# Patient Record
Sex: Male | Born: 1955 | Race: White | Hispanic: No | Marital: Married | Smoking: Never smoker
Health system: Southern US, Community
[De-identification: ages and names within clinical notes are randomized; demographics above are authoritative.]

## PROBLEM LIST (undated history)

## (undated) DIAGNOSIS — R972 Elevated prostate specific antigen [PSA]: Secondary | ICD-10-CM

## (undated) DIAGNOSIS — D649 Anemia, unspecified: Secondary | ICD-10-CM

## (undated) DIAGNOSIS — I2510 Atherosclerotic heart disease of native coronary artery without angina pectoris: Secondary | ICD-10-CM

## (undated) DIAGNOSIS — K219 Gastro-esophageal reflux disease without esophagitis: Secondary | ICD-10-CM

## (undated) DIAGNOSIS — I499 Cardiac arrhythmia, unspecified: Secondary | ICD-10-CM

## (undated) DIAGNOSIS — I219 Acute myocardial infarction, unspecified: Secondary | ICD-10-CM

## (undated) DIAGNOSIS — C801 Malignant (primary) neoplasm, unspecified: Secondary | ICD-10-CM

## (undated) DIAGNOSIS — J45909 Unspecified asthma, uncomplicated: Secondary | ICD-10-CM

## (undated) HISTORY — DX: Elevated prostate specific antigen (PSA): R97.20

## (undated) HISTORY — DX: Acute myocardial infarction, unspecified: I21.9

## (undated) HISTORY — DX: Cardiac arrhythmia, unspecified: I49.9

## (undated) HISTORY — PX: NASAL SINUS SURGERY: SHX719

## (undated) HISTORY — DX: Atherosclerotic heart disease of native coronary artery without angina pectoris: I25.10

---

## 1996-12-18 HISTORY — PX: KNEE SURGERY: SHX244

## 2008-12-18 HISTORY — PX: CORONARY ANGIOPLASTY WITH STENT PLACEMENT: SHX49

## 2009-01-11 ENCOUNTER — Ambulatory Visit: Payer: Self-pay | Admitting: Emergency Medicine

## 2009-01-11 ENCOUNTER — Inpatient Hospital Stay: Payer: Self-pay | Admitting: Cardiology

## 2009-01-11 DIAGNOSIS — I2510 Atherosclerotic heart disease of native coronary artery without angina pectoris: Secondary | ICD-10-CM

## 2009-02-16 ENCOUNTER — Encounter: Payer: Self-pay | Admitting: Cardiology

## 2009-03-18 ENCOUNTER — Encounter: Payer: Self-pay | Admitting: Cardiology

## 2009-08-09 ENCOUNTER — Ambulatory Visit: Payer: Self-pay | Admitting: Gastroenterology

## 2014-08-26 DIAGNOSIS — D509 Iron deficiency anemia, unspecified: Secondary | ICD-10-CM | POA: Insufficient documentation

## 2014-08-26 DIAGNOSIS — E785 Hyperlipidemia, unspecified: Secondary | ICD-10-CM | POA: Insufficient documentation

## 2014-08-26 DIAGNOSIS — D649 Anemia, unspecified: Secondary | ICD-10-CM | POA: Insufficient documentation

## 2015-08-24 ENCOUNTER — Encounter: Payer: Self-pay | Admitting: Obstetrics and Gynecology

## 2015-08-24 ENCOUNTER — Ambulatory Visit (INDEPENDENT_AMBULATORY_CARE_PROVIDER_SITE_OTHER): Payer: 59 | Admitting: Obstetrics and Gynecology

## 2015-08-24 ENCOUNTER — Encounter: Payer: Self-pay | Admitting: *Deleted

## 2015-08-24 VITALS — BP 125/82 | HR 77 | Resp 18 | Ht 74.0 in | Wt 256.6 lb

## 2015-08-24 DIAGNOSIS — R972 Elevated prostate specific antigen [PSA]: Secondary | ICD-10-CM

## 2015-08-24 DIAGNOSIS — I10 Essential (primary) hypertension: Secondary | ICD-10-CM | POA: Insufficient documentation

## 2015-08-24 LAB — MICROSCOPIC EXAMINATION
BACTERIA UA: NONE SEEN
EPITHELIAL CELLS (NON RENAL): NONE SEEN /HPF (ref 0–10)

## 2015-08-24 LAB — URINALYSIS, COMPLETE
Bilirubin, UA: NEGATIVE
GLUCOSE, UA: NEGATIVE
KETONES UA: NEGATIVE
Leukocytes, UA: NEGATIVE
NITRITE UA: NEGATIVE
Protein, UA: NEGATIVE
SPEC GRAV UA: 1.02 (ref 1.005–1.030)
UUROB: 0.2 mg/dL (ref 0.2–1.0)
pH, UA: 5.5 (ref 5.0–7.5)

## 2015-08-24 NOTE — Progress Notes (Signed)
08/24/2015 12:22 PM   Kenneth Mayo [birth year] [id number]  Referring provider: No referring provider defined for this encounter.  No chief complaint on file.   HPI: Mr. Beever is a 59 year old Caucasian male presenting today for 6 month follow-up for elevated PSA. He initially presented to our office on 03/24/15 with an elevated PSA 5.66 on 02/2015. Previous PSA on 02/2014 3.72. DRE was unremarkable at that time except for a slightly enlarged prostate. Patient denies any urinary symptoms including gross hematuria, dysuria, hesitancy, or sensation of incomplete bladder emptying.  03/24/15 PSA 3.2.  04/01/15 4K score indicating a 93% probability that the patient will NOT have aggressive disease on a prostate biopsy.   Patient does have a family history of prostate cancer in his brother who is only 10 months older than him area his brother who underwent radiation therapy.  I-PSS 9 QOL 2  PSA History: 02/2014 PSA 3.72 02/2015 PSA 5.66 03/24/15 PSA 3.2 04/01/2015 4K score 93% probability of NOT having aggressive prostate cancer on biopsy   PMH: Past Medical History  Diagnosis Date  . CAD (coronary artery disease)   . Heart attack   . Arrhythmia   . Elevated PSA     Surgical History: Past Surgical History  Procedure Laterality Date  . Coronary angioplasty with stent placement  2010  . Nasal sinus surgery      Home Medications:    Medication List       This list is accurate as of: 08/24/15 12:22 PM.  Always use your most recent med list.               aspirin 81 MG tablet  Take 81 mg by mouth daily.     clopidogrel 75 MG tablet  Commonly known as:  PLAVIX  Take 75 mg by mouth daily.     ferrous sulfate 325 (65 FE) MG tablet  Take 325 mg by mouth daily with breakfast.     Fish Oil 1000 MG Caps  Take 1 capsule by mouth.     glucosamine-chondroitin 500-400 MG tablet  Take 1 tablet by mouth 3 (three) times daily.     lisinopril 5 MG tablet  Commonly known as:   PRINIVIL,ZESTRIL  Take 5 mg by mouth daily.     metoprolol succinate 25 MG 24 hr tablet  Commonly known as:  TOPROL-XL  Take 25 mg by mouth daily.     pantoprazole 40 MG tablet  Commonly known as:  PROTONIX  Take 40 mg by mouth daily.     simvastatin 40 MG tablet  Commonly known as:  ZOCOR  Take 40 mg by mouth daily.        Allergies:  Allergies  Allergen Reactions  . Cefaclor Other (See Comments)  . Aspirin Rash  . Keflex [Cephalexin] Rash  . Penicillins Rash    Family History: Family History  Problem Relation Age of Onset  . Heart disease Father   . Prostate cancer Brother     Social History:  reports that he has never smoked. He does not have any smokeless tobacco history on file. He reports that he drinks alcohol. His drug history is not on file.  ROS: UROLOGY Frequent Urination?: No Hard to postpone urination?: No Burning/pain with urination?: No Get up at night to urinate?: No Leakage of urine?: No Urine stream starts and stops?: No Trouble starting stream?: No Do you have to strain to urinate?: No Blood in urine?: No Urinary tract infection?: No Sexually  transmitted disease?: No Injury to kidneys or bladder?: No Painful intercourse?: No Weak stream?: No Erection problems?: No Penile pain?: No  Gastrointestinal Nausea?: No Vomiting?: No Indigestion/heartburn?: No Diarrhea?: No Constipation?: No  Constitutional Fever: No Night sweats?: No Weight loss?: No Fatigue?: No  Skin Skin rash/lesions?: No Itching?: No  Eyes Blurred vision?: No Double vision?: No  Ears/Nose/Throat Sore throat?: No Sinus problems?: No  Hematologic/Lymphatic Swollen glands?: No Easy bruising?: No  Cardiovascular Leg swelling?: No Chest pain?: No  Respiratory Cough?: No Shortness of breath?: No  Endocrine Excessive thirst?: No  Musculoskeletal Back pain?: No Joint pain?: No  Neurological Headaches?: No Dizziness?:  No  Psychologic Depression?: No Anxiety?: No  Physical Exam: BP 125/82 mmHg  Pulse 77  Resp 18  Ht 6\' 2"  (1.88 m)  Wt 256 lb 9.6 oz (116.393 kg)  BMI 32.93 kg/m2  Constitutional:  Alert and oriented, No acute distress. HEENT: [place] AT, moist mucus membranes.  Trachea midline, no masses. Cardiovascular: No clubbing, cyanosis, or edema. Respiratory: Normal respiratory effort, no increased work of breathing. GU: No CVA tenderness. Prostate slightly enlarged on exam, smooth without nodules, nontender Skin: No rashes, bruises or suspicious lesions. Lymph: No cervical adenopathy. Neurologic: Grossly intact, no focal deficits, moving all 4 extremities. Psychiatric: Normal mood and affect.  Results for orders placed or performed in visit on 08/24/15  Microscopic Examination  Result Value Ref Range   WBC, UA 0-5 0 -  5 /hpf   RBC, UA 3-10 (A) 0 -  2 /hpf   Epithelial Cells (non renal) None seen 0 - 10 /hpf   Mucus, UA Present (A) Not Estab.   Bacteria, UA None seen None seen/Few  PSA  Result Value Ref Range   Prostate Specific Ag, Serum 3.7 0.0 - 4.0 ng/mL  Urinalysis, Complete  Result Value Ref Range   Specific Gravity, UA 1.020 1.005 - 1.030   pH, UA 5.5 5.0 - 7.5   Color, UA Yellow Yellow   Appearance Ur Clear Clear   Leukocytes, UA Negative Negative   Protein, UA Negative Negative/Trace   Glucose, UA Negative Negative   Ketones, UA Negative Negative   RBC, UA 2+ (A) Negative   Bilirubin, UA Negative Negative   Urobilinogen, Ur 0.2 0.2 - 1.0 mg/dL   Nitrite, UA Negative Negative   Microscopic Examination See below:    Assessment & Plan:    1. Elevated PSA DRE unchanged today but is limited due to patient's body habitus. It is slightly enlarged but smooth. We'll recheck PSA today and if stable will follow up in 6 months. If PSA is within normal limits patient should be able to go to yearly follow-up. - PSA - Urinalysis, Complete - BLADDER SCAN AMB NON-IMAGING  Return  in about 6 months (around 02/21/2016) for DRE/PSA.  Herbert Moors, [facility] [address] [place], [place] [address] [phone]

## 2015-08-25 ENCOUNTER — Telehealth: Payer: Self-pay | Admitting: *Deleted

## 2015-08-25 LAB — PSA: Prostate Specific Ag, Serum: 3.7 ng/mL (ref 0.0–4.0)

## 2015-08-25 NOTE — Telephone Encounter (Signed)
LM with male at the home number listed, it is a business number, left message for patient to return our call.

## 2015-08-25 NOTE — Telephone Encounter (Signed)
I spoke w/the patient and relayed their lab results.  The pt indicated understanding and had no questions. 

## 2015-08-25 NOTE — Telephone Encounter (Signed)
-----   Message from Roda Shutters, [place] sent at 08/25/2015  8:41 AM EDT ----- Please notify patient that his PSA is stable at 3.7. He needs to keep his 6 month follow-up for recheck as scheduled. Thank you

## 2016-01-10 ENCOUNTER — Encounter: Payer: Self-pay | Admitting: Obstetrics and Gynecology

## 2016-02-21 ENCOUNTER — Ambulatory Visit (INDEPENDENT_AMBULATORY_CARE_PROVIDER_SITE_OTHER): Payer: BLUE CROSS/BLUE SHIELD | Admitting: Urology

## 2016-02-21 ENCOUNTER — Encounter: Payer: Self-pay | Admitting: Urology

## 2016-02-21 VITALS — BP 156/85 | HR 74 | Ht 74.0 in | Wt 251.4 lb

## 2016-02-21 DIAGNOSIS — R3129 Other microscopic hematuria: Secondary | ICD-10-CM

## 2016-02-21 DIAGNOSIS — R972 Elevated prostate specific antigen [PSA]: Secondary | ICD-10-CM | POA: Diagnosis not present

## 2016-02-21 LAB — URINALYSIS, COMPLETE
BILIRUBIN UA: NEGATIVE
Glucose, UA: NEGATIVE
KETONES UA: NEGATIVE
LEUKOCYTES UA: NEGATIVE
Nitrite, UA: NEGATIVE
PROTEIN UA: NEGATIVE
SPEC GRAV UA: 1.02 (ref 1.005–1.030)
Urobilinogen, Ur: 0.2 mg/dL (ref 0.2–1.0)
pH, UA: 6.5 (ref 5.0–7.5)

## 2016-02-21 LAB — MICROSCOPIC EXAMINATION
Bacteria, UA: NONE SEEN
Epithelial Cells (non renal): NONE SEEN /hpf (ref 0–10)
WBC, UA: NONE SEEN /hpf (ref 0–?)

## 2016-02-21 NOTE — Progress Notes (Signed)
F/u -   1 - elevated PSA-patient with a family history of prostate cancer. His brother is 10 months older and underwent radiation therapy. Patient has never had a prostate biopsy.  PSA history:  02/2014 PSA 3.72 02/2015 PSA 5.66 03/24/15 PSA 3.2 04/01/2015 4K score 93% probability of NOT having aggressive prostate cancer on biopsy 08/2015 PSA 3.7 with normal DRE  He has a history of BPH on surveillance.   I-PSS 9 QOL 2  Today, patient seen for the above. His PSA is pending. He has a new issue which is microscopic hematuria. His September 2016 urinalysis showed 3-10 red blood cells per high-powered field. His urinalysis today also shows 3-10 red blood cells per high-powered field. No sign of infection or bacteria. Patient denies any gross hematuria. He has occasional frequency but no dysuria. He has a history of kidney stones. 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   A 13 system review of systems was obtained which was negative except for the following: Urinary frequency, sinus problems, joint pain   Social history: tobacco use no. Alcohol use yes. Recreational drug use no.   Assessment/plan: Elevated PSA-we discussed again the nature risk and benefits of PSA screening and the nature of elevated PSA. PSA today is pending.  Microscopic hematuria-we discussed the nature of microscopic hematuria. We discussed the nature risks and benefits of the CT scan with IV contrast and cystoscopy and the patient elects to proceed.

## 2016-02-22 LAB — PSA: PROSTATE SPECIFIC AG, SERUM: 3.9 ng/mL (ref 0.0–4.0)

## 2016-03-14 ENCOUNTER — Ambulatory Visit
Admission: RE | Admit: 2016-03-14 | Discharge: 2016-03-14 | Disposition: A | Discharge status: Home / Self Care | Payer: BLUE CROSS/BLUE SHIELD | Source: Ambulatory Visit | Attending: Urology | Admitting: Urology

## 2016-03-14 DIAGNOSIS — N400 Enlarged prostate without lower urinary tract symptoms: Secondary | ICD-10-CM | POA: Insufficient documentation

## 2016-03-14 DIAGNOSIS — I2510 Atherosclerotic heart disease of native coronary artery without angina pectoris: Secondary | ICD-10-CM | POA: Diagnosis not present

## 2016-03-14 DIAGNOSIS — K760 Fatty (change of) liver, not elsewhere classified: Secondary | ICD-10-CM | POA: Diagnosis not present

## 2016-03-14 DIAGNOSIS — R3129 Other microscopic hematuria: Secondary | ICD-10-CM | POA: Diagnosis present

## 2016-03-14 LAB — POCT I-STAT CREATININE: CREATININE: 0.9 mg/dL (ref 0.61–1.24)

## 2016-03-14 IMAGING — CT CT ABD-PEL WO/W CM
2 of 4 series · 13 of 32 positions shown, 18 images · IV contrast (iopamidol)
Comparison: None.

CLINICAL DATA: Elevated PSA.  Macroscopic hematuria.  Asymptomatic.

EXAM:
CT ABDOMEN AND PELVIS WITHOUT AND WITH CONTRAST
TECHNIQUE: Multidetector CT imaging of the abdomen and pelvis was performed
following the standard protocol before and following the bolus
administration of intravenous contrast.
CONTRAST:  125mL [0W] IOPAMIDOL ([0W]) INJECTION 61%

[Series 4: axial post · axial · 0.82mm/px · z∈[-1040,-644]mm · 8 of 103 slices shown, 13 images]
[im 12/103  soft-tissue]
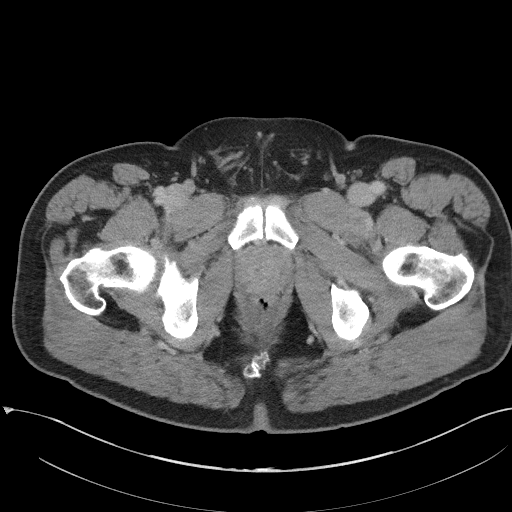
[im 12/103  bone]
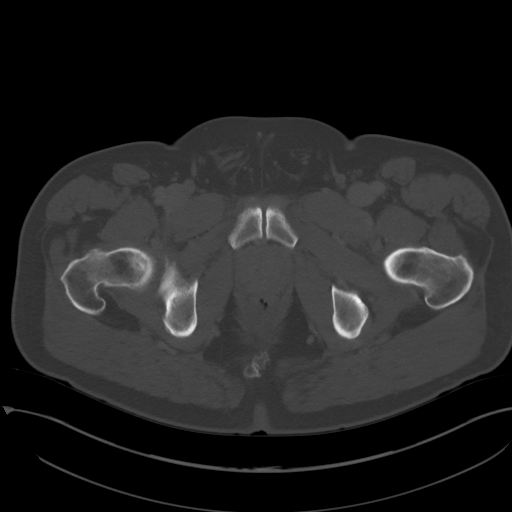
[im 23/103  soft-tissue]
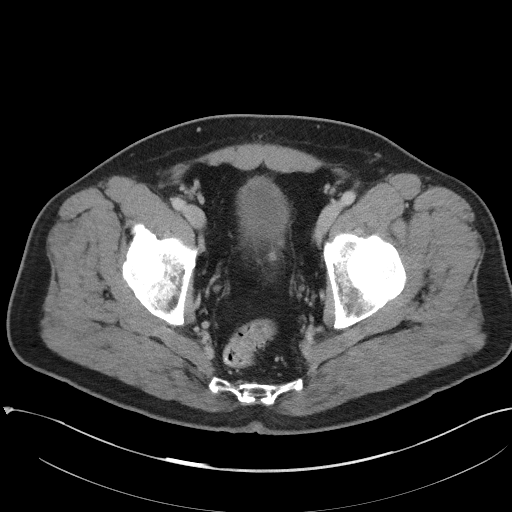
[im 35/103  soft-tissue]
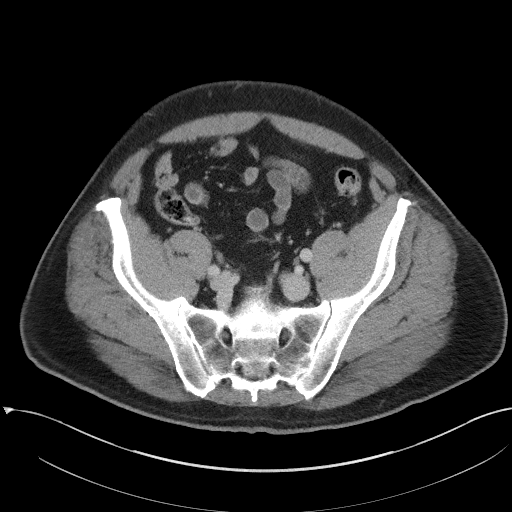
[im 46/103  soft-tissue]
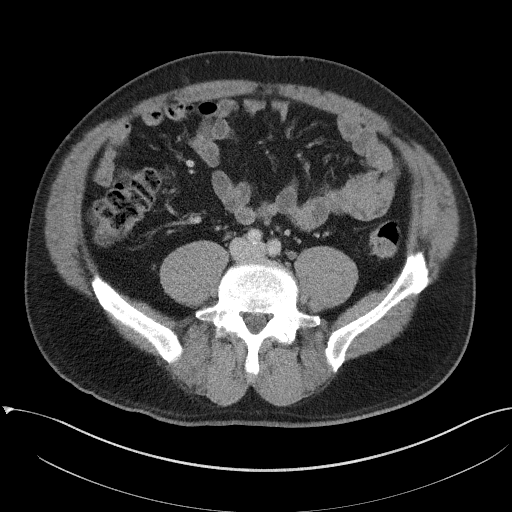
[im 57/103  soft-tissue]
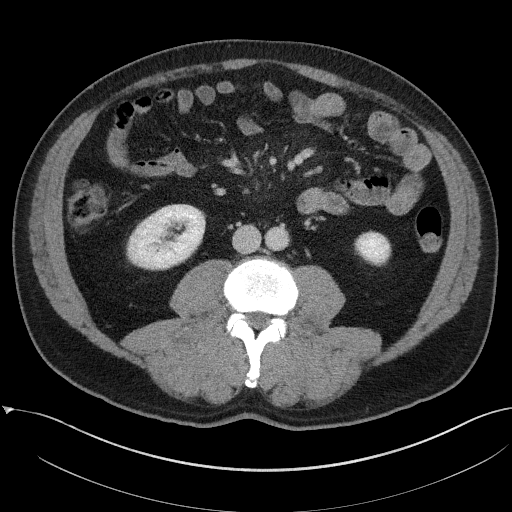
[im 57/103  lung]
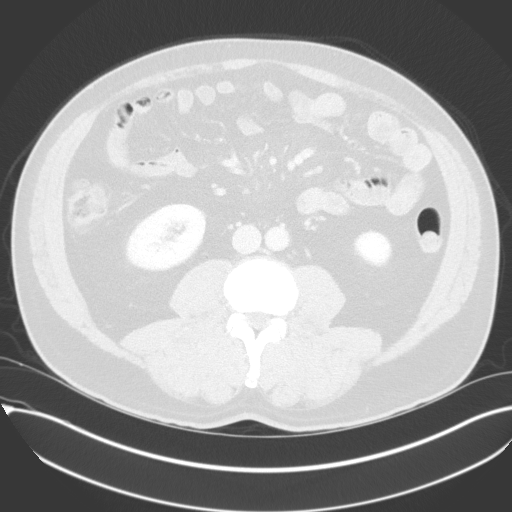
[im 69/103  soft-tissue]
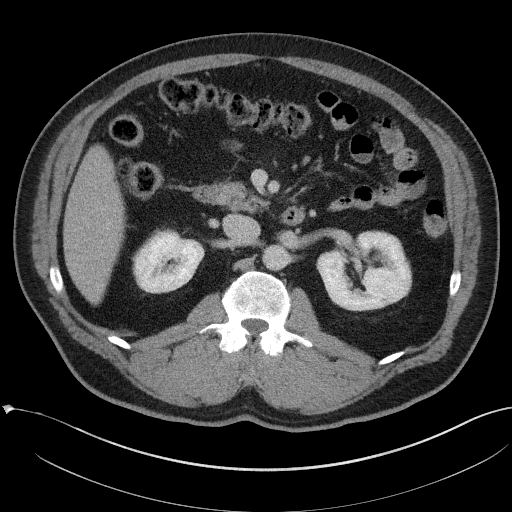
[im 69/103  lung]
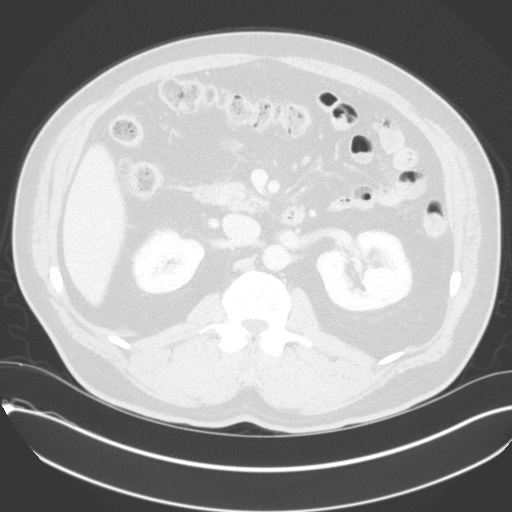
[im 80/103  soft-tissue]
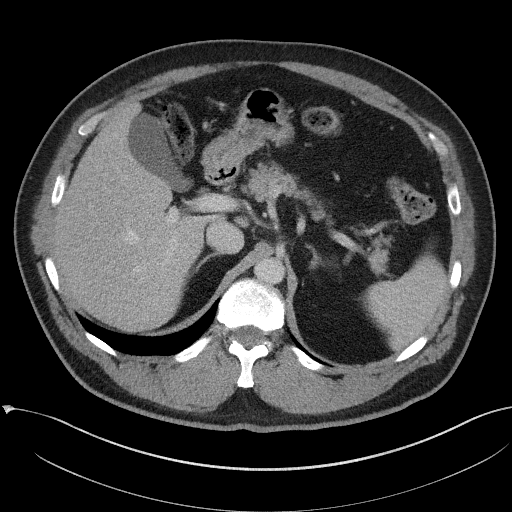
[im 80/103  lung]
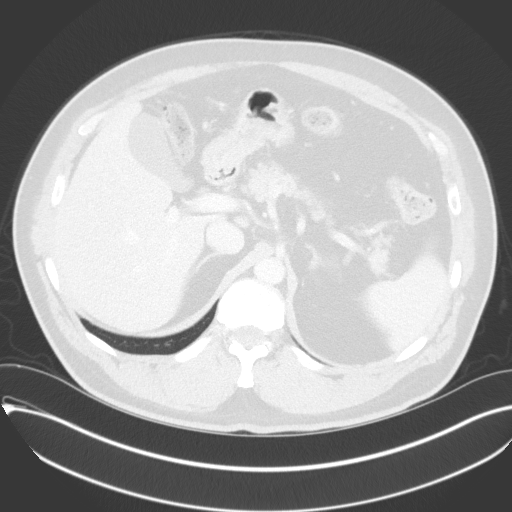
[im 91/103  soft-tissue]
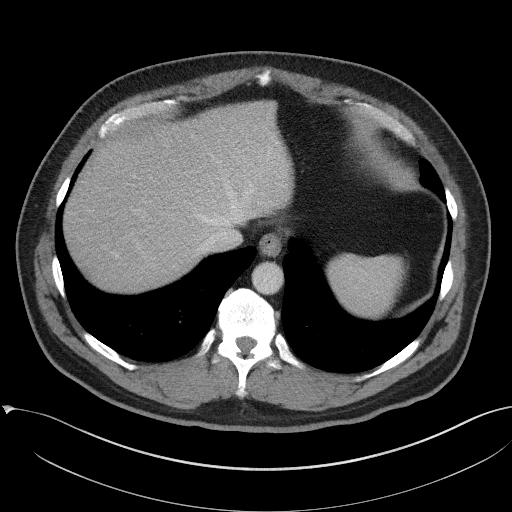
[im 91/103  lung]
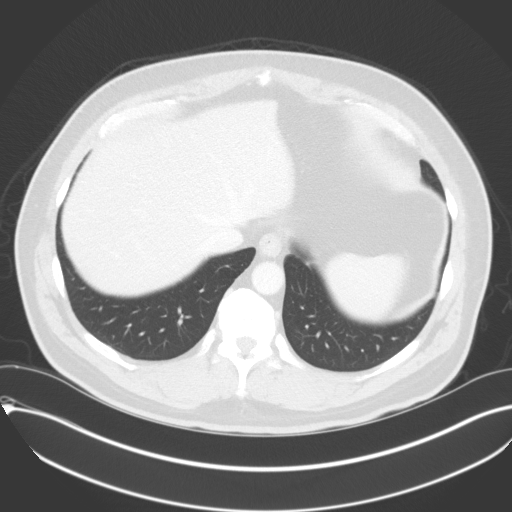

[Series 12: axial delay · axial · delayed · 0.82mm/px · z∈[-1026,-796]mm · 5 of 105 slices shown]
[im 12/105  soft-tissue]
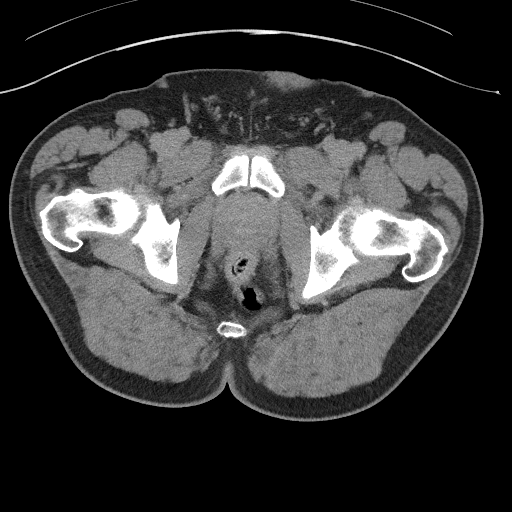
[im 24/105  soft-tissue]
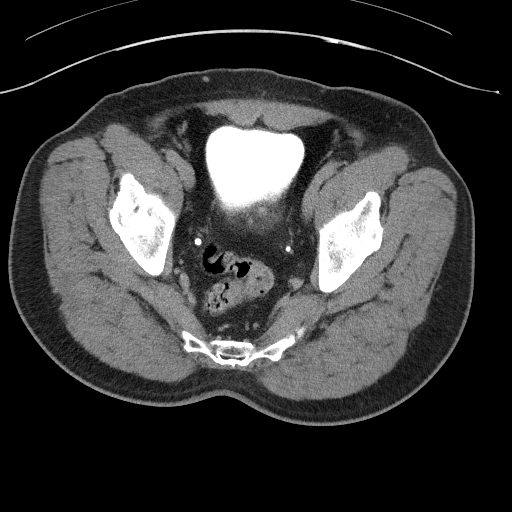
[im 35/105  soft-tissue]
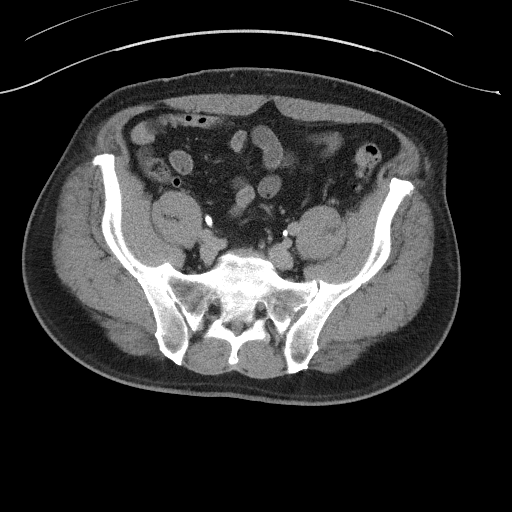
[im 47/105  soft-tissue]
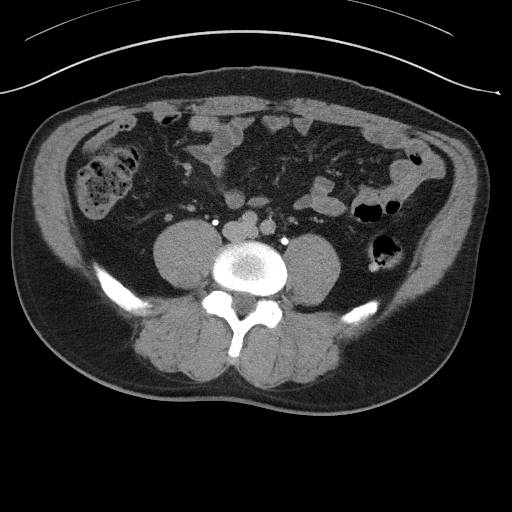
[im 58/105  soft-tissue]
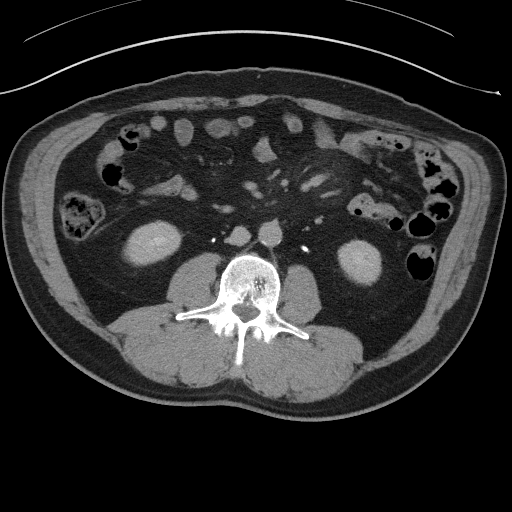

[13 of 32 positions shown; findings below may reference images not displayed]

FINDINGS: Lower chest: Clear lung bases. Borderline cardiomegaly, without
pericardial or pleural effusion. Right coronary artery
atherosclerosis.

Hepatobiliary: Mild hepatic steatosis. No focal liver lesion. Normal
gallbladder, without biliary ductal dilatation.

Pancreas: Normal, without mass or ductal dilatation.

Spleen: Normal in size, without focal abnormality.

Adrenals/Urinary Tract: Normal adrenal glands. No renal calculi or
hydronephrosis. No hydroureter or ureteric calculi. No bladder
calculi. Bilateral renal sinus cysts. An interpolar right renal
lesion is too small to characterize. Moderate renal collecting
system opacification on delayed images. Good ureteric opacification,
without filling defect. Small volume non-opacified urine within the
non dependent bladder. Given this factor, no bladder filling defect
identified. No enhancing bladder mass.

Stomach/Bowel: Normal stomach, without wall thickening. Scattered
colonic diverticula. Normal terminal ileum and appendix. Normal
small bowel.

Vascular/Lymphatic: Aortic and branch vessel atherosclerosis. No
retroperitoneal or retrocrural adenopathy. Small nodes in the
jejunal mesentery with increased density in the mesenteric fat.
Example image 41/series 4. No pelvic sidewall adenopathy.

Reproductive: Mild prostatomegaly.

Other: No significant free fluid.

Musculoskeletal: No acute osseous abnormality. Minimal convex left
lumbar spine curvature.
IMPRESSION: 1.  No acute process or explanation for hematuria.
2. Hepatic steatosis.
3.  Atherosclerosis, including within the coronary arteries.
4. Prostatomegaly.
5. Jejunal mesenteric findings which could represent mesenteric
adenitis/panniculitis. This is of indeterminate acuity and clinical
significance, and can be seen in asymptomatic individuals.

## 2016-03-14 MED ORDER — IOPAMIDOL (ISOVUE-300) INJECTION 61%
125.0000 mL | Freq: Once | INTRAVENOUS | Status: AC | PRN
  Administered 2016-03-14: 125 mL via INTRAVENOUS

## 2016-03-16 ENCOUNTER — Telehealth: Payer: Self-pay

## 2016-03-16 NOTE — Telephone Encounter (Signed)
-----   Message from Festus Aloe, MD sent at 03/16/2016  8:15 AM EDT ----- Notify patient his CT scan was negative for any worrisome findings. F/u as planned.    ----- Message -----    From: Lestine Box, LPN    Sent: [id number]   2:51 PM      To: Festus Aloe, MD    ----- Message -----    From: Rad Results In Interface    Sent: 03/14/2016  10:29 AM      To: [facility]

## 2016-03-16 NOTE — Telephone Encounter (Signed)
Spoke with pt in reference to CT results. Pt voiced understanding.  

## 2016-03-20 ENCOUNTER — Ambulatory Visit (INDEPENDENT_AMBULATORY_CARE_PROVIDER_SITE_OTHER): Payer: BLUE CROSS/BLUE SHIELD | Admitting: Urology

## 2016-03-20 ENCOUNTER — Encounter: Payer: Self-pay | Admitting: Urology

## 2016-03-20 VITALS — BP 141/95 | HR 80 | Ht 74.0 in | Wt 250.9 lb

## 2016-03-20 DIAGNOSIS — R972 Elevated prostate specific antigen [PSA]: Secondary | ICD-10-CM | POA: Diagnosis not present

## 2016-03-20 DIAGNOSIS — R3129 Other microscopic hematuria: Secondary | ICD-10-CM | POA: Diagnosis not present

## 2016-03-20 LAB — MICROSCOPIC EXAMINATION
BACTERIA UA: NONE SEEN
Epithelial Cells (non renal): NONE SEEN /hpf (ref 0–10)
WBC, UA: NONE SEEN /hpf (ref 0–?)

## 2016-03-20 LAB — URINALYSIS, COMPLETE
Bilirubin, UA: NEGATIVE
GLUCOSE, UA: NEGATIVE
KETONES UA: NEGATIVE
LEUKOCYTES UA: NEGATIVE
NITRITE UA: NEGATIVE
PROTEIN UA: NEGATIVE
SPEC GRAV UA: 1.02 (ref 1.005–1.030)
Urobilinogen, Ur: 0.2 mg/dL (ref 0.2–1.0)
pH, UA: 5.5 (ref 5.0–7.5)

## 2016-03-20 MED ORDER — CIPROFLOXACIN HCL 500 MG PO TABS
500.0000 mg | ORAL_TABLET | Freq: Once | ORAL | Status: AC
Start: 2016-03-20 — End: 2016-03-20
  Administered 2016-03-20: 500 mg via ORAL

## 2016-03-20 MED ORDER — LIDOCAINE HCL 2 % EX GEL
1.0000 "application " | Freq: Once | CUTANEOUS | Status: AC
  Administered 2016-03-20: 1 via URETHRAL

## 2016-03-20 NOTE — Progress Notes (Signed)
Expand All Collapse All   F/u -   1 - elevated PSA-patient with a family history of prostate cancer. His brother is 10 months older and underwent radiation therapy. Patient has never had a prostate biopsy.  PSA history:  02/2014 PSA 3.72 02/2015 PSA 5.66 03/24/15 PSA 3.2 04/01/2015 4K score 93% probability of NOT having aggressive prostate cancer on biopsy 08/2015 PSA 3.7 with normal DRE 02/2016 prostate 75 g on CT   He has a history of BPH on surveillance. I-PSS 9 QOL 2  2- MH - September 2016 urinalysis showed 3-10 red blood cells per high-powered field. Repeat UA with 3-10 red blood cells per high-powered field. He has a history of kidney stones. 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   Today, Wendle is seen for f/u of MH. He continues to have microscopic hematuria on urinalysis with 3-10 red blood cells per high-powered field today. He underwent a CT scan of the abdomen and pelvis and I reviewed the images today. There was a small cyst in each kidney but otherwise normal. The prostate measured about 75 g. No worrisome findings.  Procedure: Cystoscopy-after consent was obtained the patient was placed supine and prepped and draped in the usual sterile fashion. The cystoscope was advanced per urethra, flexible, the bladder inspected and the scope removed. Findings: The urethra was unremarkable. Prostate had mild enlargement but did not appear obstructed. The bladder was unremarkable. The trigone and ureteral orifices were in their normal orthotopic position. There was clear efflux bilaterally. The bladder mucosa appeared normal without tumors or erythema. There were no stones or foreign bodies in the bladder. He is well today without gross hematuria or dysuria.  Assessment/plan:  #1 elevated PSA-patient with a stable PSA, normal DRE and normal PSA density. A follow-up in 6 months with another PSA and if stable he could likely go to  yearly f/u.   #2 microscopic hematuria-benign evaluation.

## 2016-09-12 ENCOUNTER — Other Ambulatory Visit: Payer: BLUE CROSS/BLUE SHIELD

## 2016-09-12 DIAGNOSIS — R972 Elevated prostate specific antigen [PSA]: Secondary | ICD-10-CM

## 2016-09-13 LAB — FPSA% REFLEX
% FREE PSA: 25.3 %
PSA, FREE: 1.77 ng/mL

## 2016-09-13 LAB — PSA TOTAL (REFLEX TO FREE): Prostate Specific Ag, Serum: 7 ng/mL — ABNORMAL HIGH (ref 0.0–4.0)

## 2016-09-14 ENCOUNTER — Telehealth: Payer: Self-pay

## 2016-09-14 NOTE — Telephone Encounter (Signed)
Notify patient his PSA was elevated, but % free PSA normal. F/u as planned. Give result.- Dr. Octavia Bruckner with pt in reference to PSA results. Pt voiced understanding.

## 2016-09-22 DIAGNOSIS — R972 Elevated prostate specific antigen [PSA]: Secondary | ICD-10-CM | POA: Insufficient documentation

## 2016-09-25 ENCOUNTER — Ambulatory Visit (INDEPENDENT_AMBULATORY_CARE_PROVIDER_SITE_OTHER): Payer: BLUE CROSS/BLUE SHIELD | Admitting: Urology

## 2016-09-25 ENCOUNTER — Encounter: Payer: Self-pay | Admitting: Urology

## 2016-09-25 ENCOUNTER — Telehealth: Payer: Self-pay

## 2016-09-25 VITALS — BP 149/79 | HR 76 | Ht 74.0 in | Wt 248.3 lb

## 2016-09-25 DIAGNOSIS — R972 Elevated prostate specific antigen [PSA]: Secondary | ICD-10-CM

## 2016-09-25 NOTE — Telephone Encounter (Signed)
I called and spoke w/ Mardene Celeste from Dr. Anitra Lauth office to inform her we need a clearance for the pt to stop Plavix and ASA for 7 days prior to Prostate Biopsy scheduled on Oct 25th.  Clearance request faxed over.

## 2016-09-25 NOTE — Progress Notes (Signed)
09/25/2016 8:46 AM   Kenneth Mayo 10-Dec-1956 [id number]  Referring provider: Tracie Harrier, MD [address] [facility] [place], [place] [address]  Chief Complaint  Patient presents with  . Elevated PSA    HPI: 1 - elevated PSA-patient with a family history of prostate cancer. His brother is 10 months older and underwent radiation therapy. Patient has never had a prostate biopsy.  PSA history:  02/2014 PSA 3.72 02/2015 PSA 5.66 03/24/15 PSA 3.2 04/01/2015 4K score 93% probability of NOT having aggressive prostate cancer on biopsy 08/2015 PSA 3.7 with normal DRE 02/2016 prostate 75 g on CT   He has a history of BPH on surveillance. I-PSS 9 QOL 2  2- MH - September 2016 urinalysis showed 3-10 red blood cells per high-powered field. Repeat UA with 3-10 red blood cells per high-powered field. He has a history of kidney stones. 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  He underwent a CT scan of the abdomen and pelvis Mar 2017 - there was a small cyst in each kidney but otherwise normal. The prostate measured about 75 g. No worrisome findings.He had a normal cystoscopy Apr 2017.   Today, pt is seen for the above. His PSA has risen to 7 with a 25%free. He has been well. No dysuria or gross hematuria.    PMH: Past Medical History:  Diagnosis Date  . Arrhythmia   . CAD (coronary artery disease)   . Elevated PSA   . Heart attack     Surgical History: Past Surgical History:  Procedure Laterality Date  . CORONARY ANGIOPLASTY WITH STENT PLACEMENT  2010  . NASAL SINUS SURGERY      Home Medications:    Medication List       Accurate as of 09/25/16  8:46 AM. Always use your most recent med list.          aspirin 81 MG tablet Take 81 mg by mouth daily.   clopidogrel 75 MG tablet Commonly known as:  PLAVIX Take by mouth.   Co-Enzyme Q-10 30 MG Caps Take by mouth.   ferrous sulfate 325 (65  FE) MG tablet Take by mouth.   Fish Oil 1000 MG Caps Take 1 capsule by mouth.   FISH OIL PO Take by mouth.   glucosamine-chondroitin 500-400 MG tablet Take 1 tablet by mouth 3 (three) times daily.   lisinopril 5 MG tablet Commonly known as:  PRINIVIL,ZESTRIL Take by mouth.   metoprolol succinate 25 MG 24 hr tablet Commonly known as:  TOPROL-XL Take by mouth.   nitroGLYCERIN 0.4 MG SL tablet Commonly known as:  NITROSTAT Place under the tongue.   pantoprazole 40 MG tablet Commonly known as:  PROTONIX Take by mouth.   rosuvastatin 5 MG tablet Commonly known as:  CRESTOR Take by mouth.       Allergies:  Allergies  Allergen Reactions  . Cefaclor Other (See Comments)  . Aspirin Rash  . Keflex [Cephalexin] Rash  . Other Rash    ASPRIN. ASPRIN  . Penicillins Rash    Family History: Family History  Problem Relation Age of Onset  . Heart disease Father   . Prostate cancer Brother     Social History:  reports that he has never smoked. He does not have any smokeless tobacco history on file. He reports that he drinks alcohol. He reports that he does not use drugs.  ROS: UROLOGY Frequent Urination?:  No Hard to postpone urination?: No Burning/pain with urination?: No Get up at night to urinate?: No Leakage of urine?: No Urine stream starts and stops?: No Trouble starting stream?: No Do you have to strain to urinate?: No Blood in urine?: No Urinary tract infection?: No Sexually transmitted disease?: No Injury to kidneys or bladder?: No Painful intercourse?: No Weak stream?: No Erection problems?: No Penile pain?: No  Gastrointestinal Nausea?: No Vomiting?: No Indigestion/heartburn?: No Diarrhea?: No Constipation?: No  Constitutional Fever: No Night sweats?: No Weight loss?: No Fatigue?: No  Skin Skin rash/lesions?: No Itching?: No  Eyes Blurred vision?: No Double vision?: No  Ears/Nose/Throat Sore throat?: No Sinus problems?:  No  Hematologic/Lymphatic Swollen glands?: No Easy bruising?: No  Cardiovascular Leg swelling?: No Chest pain?: No  Respiratory Cough?: No Shortness of breath?: No  Endocrine Excessive thirst?: No  Musculoskeletal Back pain?: No Joint pain?: No  Neurological Headaches?: No Dizziness?: No  Psychologic Depression?: No Anxiety?: No  Physical Exam: BP (!) 149/79   Pulse 76   Ht 6\' 2"  (1.88 m)   Wt 248 lb 4.8 oz (112.6 kg)   BMI 31.88 kg/m   Constitutional:  Alert and oriented, No acute distress. HEENT: [place] AT, moist mucus membranes.  Trachea midline, no masses. Cardiovascular: No clubbing, cyanosis, or edema. Respiratory: Normal respiratory effort, no increased work of breathing. Skin: No rashes, bruises or suspicious lesions. Neurologic: Grossly intact, no focal deficits, moving all 4 extremities. Psychiatric: Normal mood and affect.  Laboratory Data: No results found for: WBC, HGB, HCT, MCV, PLT  Lab Results  Component Value Date   CREATININE 0.90 03/14/2016    No results found for: PSA  No results found for: TESTOSTERONE  No results found for: HGBA1C  Urinalysis    Component Value Date/Time   APPEARANCEUR Clear 03/20/2016 0832   GLUCOSEU Negative 03/20/2016 0832   BILIRUBINUR Negative 03/20/2016 0832   PROTEINUR Negative 03/20/2016 0832   NITRITE Negative 03/20/2016 0832   LEUKOCYTESUR Negative 03/20/2016 0832    Pertinent Imaging:   Assessment & Plan:   1) PSA elevation - .I had a long discussion with the patient on the nature of elevated PSA - benign vs malignant causes. We discussed age specific levels and that PCa can be seen on a biopsy with very low PSA levels (<=2.5). We discussed the nature risks and benefits of continued surveillance, other lab tests, imaging as well as prostate biopsy. We discussed the management of prostate cancer might include active surveillance or treatment depending on biopsy findings. We went over his risk of a  high grade PCa is low given the higher %free PSA. All questions answered. He elects to proceed with prostate biopsy which will be arranged. We discussed one of my colleagues may be doing his biopsy.   There are no diagnoses linked to this encounter.  No Follow-up on file.  Festus Aloe, [facility] [address] [place],  [address] [phone]

## 2016-09-27 DIAGNOSIS — Z01810 Encounter for preprocedural cardiovascular examination: Secondary | ICD-10-CM | POA: Insufficient documentation

## 2016-10-02 ENCOUNTER — Telehealth: Payer: Self-pay

## 2016-10-02 NOTE — Telephone Encounter (Signed)
The pt was notified by Dr. Idelle Leech office when he went for his f/u appt about his cardiac clearance.I also with back over the instruction. I informed him that I spoke w/ Dr. Junious Silk and he was okay with proceeding w/ the biopsy. The pt verbalize understanding.

## 2016-10-02 NOTE — Telephone Encounter (Signed)
-----   Message from Festus Aloe, MD sent at 09/28/2016 10:58 AM EDT ----- Regarding: RE: Prostate Biopsy  Contact: [phone] He is OK to proceed with the 5 day, 3 day plan.   ----- Message ----- From: Wilson Singer, CMA Sent: 09/27/2016   1:26 PM To: Festus Aloe, MD Subject: Prostate Biopsy                                FYI this pt is scheduled for a Prostate Biopsy with you on 10/11/16. I faxed over a cardiac clearance for the pt to stop ASA and Plavix (7) days prior to his cardiologist. He was cleared to stop Plavix 5 days prior and ASA 3 days prior. Please advise if you are still okay to procedure.

## 2016-10-11 ENCOUNTER — Ambulatory Visit: Payer: BLUE CROSS/BLUE SHIELD | Admitting: Urology

## 2016-10-11 ENCOUNTER — Encounter: Payer: Self-pay | Admitting: Urology

## 2016-10-11 ENCOUNTER — Other Ambulatory Visit: Payer: Self-pay | Admitting: Urology

## 2016-10-11 VITALS — BP 129/74 | HR 81 | Ht 74.0 in | Wt 245.0 lb

## 2016-10-11 DIAGNOSIS — R972 Elevated prostate specific antigen [PSA]: Secondary | ICD-10-CM | POA: Diagnosis not present

## 2016-10-11 MED ORDER — GENTAMICIN SULFATE 40 MG/ML IJ SOLN
80.0000 mg | Freq: Once | INTRAMUSCULAR | Status: AC
  Administered 2016-10-11: 80 mg via INTRAMUSCULAR

## 2016-10-11 MED ORDER — LEVOFLOXACIN 500 MG PO TABS
500.0000 mg | ORAL_TABLET | Freq: Once | ORAL | Status: AC
  Administered 2016-10-11: 500 mg via ORAL

## 2016-10-11 NOTE — Progress Notes (Signed)
Pt returns for prostate biopsy for PSA elevation. Patient with a family history of prostate cancer. His brother is 10 months older and underwent radiation therapy. Patient has never had a prostate biopsy.  PSA history:  02/2014 PSA 3.72 02/2015 PSA 5.66 03/24/15 PSA 3.2 04/01/2015 4K score 93% probability of NOT having aggressive prostate cancer on biopsy 08/2015 PSA 3.7 with normal DRE 02/2016 prostate 75 g on CT  08/2016 PSA 7 with a 25% free  He has a history of BPH on surveillance. I-PSS 9 QOL 2  .Prostate Biopsy Procedure   Informed consent was obtained after discussing risks/benefits of the procedure.  A time out was performed to ensure correct patient identity.  Pre-Procedure: - Last PSA Level: No results found for: PSA : 7 - Gentamicin given prophylactically - Levaquin 500 mg administered PO -Transrectal Ultrasound performed revealing a 67.32 gm prostate, otherwise capsule, SV's, prostate appeared normal.  -No significant hypoechoic or median lobe noted -DRE: prostate benign apart from some very subtle induration over left mid  Procedure: - Prostate block performed using 10 cc 1% lidocaine and biopsies taken from sextant areas, a total of 12 under ultrasound guidance.  Post-Procedure: - Patient tolerated the procedure well - He was counseled to seek immediate medical attention if experiences any severe pain, significant bleeding, or fevers - Return in one week to discuss biopsy results

## 2016-10-14 LAB — PATHOLOGY REPORT

## 2016-10-17 ENCOUNTER — Other Ambulatory Visit: Payer: Self-pay | Admitting: Urology

## 2016-10-23 ENCOUNTER — Ambulatory Visit (INDEPENDENT_AMBULATORY_CARE_PROVIDER_SITE_OTHER): Payer: BLUE CROSS/BLUE SHIELD | Admitting: Urology

## 2016-10-23 ENCOUNTER — Encounter: Payer: Self-pay | Admitting: Urology

## 2016-10-23 VITALS — BP 129/67 | HR 66 | Ht 74.0 in | Wt 246.7 lb

## 2016-10-23 DIAGNOSIS — C61 Malignant neoplasm of prostate: Secondary | ICD-10-CM

## 2016-10-23 NOTE — Progress Notes (Signed)
10/23/2016 12:19 PM   Kenneth Mayo 1955-12-27 [id number]  Referring provider: Tracie Harrier, MD [address] [facility] [place], [place] [address]  Chief Complaint  Patient presents with  . Follow-up    Prostate biopsy    HPI:  Pt returns to review positive prostate biopsy 10/11/2016. Patient with a family history of prostate cancer. His brother is 10 months older and underwent radiation therapy (sounds like brachytherapy).  PSA history:  02/2014 PSA 3.72 02/2015 PSA 5.66 03/24/15 PSA 3.2 04/01/2015 4K score 93% probability of NOT having aggressive prostate cancer on biopsy 08/2015 PSA 3.7 with normal DRE 02/2016 prostate 75 g on CT  08/2016 PSA 7 with a 25% free  He has a history of BPH on surveillance. I-PSS 9 QOL 18 Sep 2016 PSA 7, prostate 67 grams, T1c (subtle induration on left but this was benign) Gleason 3+3=6 left apex 1% Gleason 3+3=6 right mid 92%   LUTS mainly frequency, urgency, nocturia.   PMH: Past Medical History:  Diagnosis Date  . Arrhythmia   . CAD (coronary artery disease)   . Elevated PSA   . Heart attack     Surgical History: Past Surgical History:  Procedure Laterality Date  . CORONARY ANGIOPLASTY WITH STENT PLACEMENT  2010  . NASAL SINUS SURGERY      Home Medications:    Medication List       Accurate as of 10/23/16 12:19 PM. Always use your most recent med list.          aspirin 81 MG tablet Take 81 mg by mouth daily.   clopidogrel 75 MG tablet Commonly known as:  PLAVIX Take by mouth.   Co-Enzyme Q-10 30 MG Caps Take by mouth.   ferrous sulfate 325 (65 FE) MG tablet Take by mouth.   Fish Oil 1000 MG Caps Take 1 capsule by mouth.   FISH OIL PO Take by mouth.   glucosamine-chondroitin 500-400 MG tablet Take 1 tablet by mouth 3 (three) times daily.   lisinopril 5 MG tablet Commonly known as:  PRINIVIL,ZESTRIL Take by mouth.   metoprolol succinate 25 MG 24 hr tablet Commonly known as:   TOPROL-XL Take by mouth.   nitroGLYCERIN 0.4 MG SL tablet Commonly known as:  NITROSTAT Place under the tongue.   pantoprazole 40 MG tablet Commonly known as:  PROTONIX Take by mouth.   rosuvastatin 5 MG tablet Commonly known as:  CRESTOR Take by mouth.       Allergies:  Allergies  Allergen Reactions  . Cefaclor Other (See Comments)  . Aspirin Rash  . Keflex [Cephalexin] Rash  . Other Rash    ASPRIN. ASPRIN  . Penicillins Rash    Family History: Family History  Problem Relation Age of Onset  . Heart disease Father   . Prostate cancer Brother     Social History:  reports that he has never smoked. He does not have any smokeless tobacco history on file. He reports that he drinks alcohol. He reports that he does not use drugs.  ROS: UROLOGY Frequent Urination?: Yes Hard to postpone urination?: Yes Burning/pain with urination?: No Get up at night to urinate?: Yes Leakage of urine?: No Urine stream starts and stops?: No Trouble starting stream?: No Do you have to strain to urinate?: No Blood in urine?: No Urinary tract infection?: No Sexually transmitted disease?: No Injury to kidneys or bladder?: No Painful intercourse?: No Weak stream?: No Erection problems?: No Penile pain?: No  Gastrointestinal Nausea?:  No Vomiting?: No Indigestion/heartburn?: No Diarrhea?: No Constipation?: No  Constitutional Fever: No Night sweats?: No Weight loss?: No Fatigue?: No  Skin Skin rash/lesions?: No Itching?: No  Eyes Blurred vision?: No Double vision?: No  Ears/Nose/Throat Sore throat?: No Sinus problems?: No  Hematologic/Lymphatic Swollen glands?: No Easy bruising?: No  Cardiovascular Leg swelling?: No Chest pain?: No  Respiratory Cough?: No Shortness of breath?: No  Endocrine Excessive thirst?: No  Musculoskeletal Back pain?: No Joint pain?: No  Neurological Headaches?: No Dizziness?: No  Psychologic Depression?: No Anxiety?:  No  Physical Exam: BP 129/67   Pulse 66   Ht 6\' 2"  (1.88 m)   Wt 111.9 kg (246 lb 11.2 oz)   BMI 31.67 kg/m   Constitutional:  Alert and oriented, No acute distress. HEENT: [place] AT, moist mucus membranes.  Trachea midline, no masses. Cardiovascular: No clubbing, cyanosis, or edema. GI: Abdomen is soft, nontender, nondistended, no abdominal masses Skin: No rashes, bruises or suspicious lesions. Neurologic: Grossly intact, no focal deficits, moving all 4 extremities. Psychiatric: Normal mood and affect.  Laboratory Data: No results found for: WBC, HGB, HCT, MCV, PLT  Lab Results  Component Value Date   CREATININE 0.90 03/14/2016    No results found for: PSA  No results found for: TESTOSTERONE  No results found for: HGBA1C  Urinalysis    Component Value Date/Time   APPEARANCEUR Clear 03/20/2016 0832   GLUCOSEU Negative 03/20/2016 0832   BILIRUBINUR Negative 03/20/2016 0832   PROTEINUR Negative 03/20/2016 0832   NITRITE Negative 03/20/2016 0832   LEUKOCYTESUR Negative 03/20/2016 0832     Assessment & Plan:   Low Risk PCa - Using the  Understanding prostate cancer booklet and the 100 questions but I discussed with the patient and his wife his biopsy results including his stage, grade and prognosis. We went over relevant anatomy in the nature risk and benefits of active surveillance, radiation therapy, brachytherapy.We also discussed other modalities such as HIFU and Cryo. We discussed in detail how surgery and radiation therapy might affect bowel, bladder and sexual function. We discussed how each treatment might effect salvage treatments.  He will consider. I'll have him see Dr. Erlene Quan in about 3 weeks to discuss surgery, brachytherapy and active surveillance in more detail. He does have a large prostate at about 68 g and some urinary symptoms. I'll have him fill out an IIEF. One option might be to repeat a biopsy in 3 months to confirm grade prior to any definitive treatment  decisions. We also discussed the role of MRI in evaluating prostate cancer. He has low risk disease although one core had 92% PCa involvement.   There are no diagnoses linked to this encounter.  No Follow-up on file.  Festus Aloe, [facility] [address], [place],  [address] [phone]

## 2016-11-17 ENCOUNTER — Encounter: Payer: Self-pay | Admitting: Urology

## 2016-11-17 ENCOUNTER — Ambulatory Visit: Payer: BLUE CROSS/BLUE SHIELD | Admitting: Urology

## 2016-11-17 VITALS — BP 121/77 | HR 77 | Ht 74.0 in | Wt 247.0 lb

## 2016-11-17 DIAGNOSIS — C61 Malignant neoplasm of prostate: Secondary | ICD-10-CM

## 2016-11-17 NOTE — Progress Notes (Addendum)
11/17/2016 2:34 PM   Kenneth Mayo 14-Feb-1956 [id number]  Referring provider: Tracie Harrier, MD [address] [facility] [place], [place] [address]  Chief Complaint  Patient presents with  . Prostate Cancer    Discuss Surgery    HPI: 60 year old male with newly diagnosed Gleason 3+3 prostate cancer, low risk, T1c returns today for second opinion and further discuss robotic prostatectomy.  He underwent prostate biopsy on 10/11/2016.   Patient with a family history of prostate cancer. His brother is 10 months older and underwent radiation therapy (sounds like brachytherapy).   PSA history:  02/2014 PSA 3.72 02/2015 PSA 5.66 03/24/15 PSA 3.2 04/01/2015 4K score 93% probability of NOT having aggressive prostate cancer on biopsy 08/2015 PSA 3.7 with normal DRE 02/2016 prostate 75 g on CT  08/2016 PSA 7 with a 25% free  He has a history of BPH on surveillance. I-PSS 9 QOL 18 Sep 2016 PSA 7, prostate 67 grams, T1c (subtle induration on left but this was benign) Gleason 3+3=6 left apex 1% Gleason 3+3=6 right mid 92%   LUTS mainly frequency, urgency, nocturia.   He does report some very mild erectile dysfunction which he attributes to his cardiac medications. These include slightly less full erection and occasional difficulty maintaining although this is rare.      SHIM    Row Name 11/17/16 1613         SHIM: Over the last 6 months:   How do you rate your confidence that you could get and keep an erection? High     When you had erections with sexual stimulation, how often were your erections hard enough for penetration (entering your partner)? Most Times (much more than half the time)     During sexual intercourse, how often were you able to maintain your erection after you had penetrated (entered) your partner? Slightly Difficult     During sexual intercourse, how difficult was it to maintain your erection to completion of intercourse? Slightly Difficult     When you attempted sexual intercourse, how often was it satisfactory for you? Not Difficult       SHIM Total Score   SHIM 21        PMH: Past Medical History:  Diagnosis Date  . Arrhythmia   . CAD (coronary artery disease)   . Elevated PSA   . Heart attack     Surgical History: Past Surgical History:  Procedure Laterality Date  . CORONARY ANGIOPLASTY WITH STENT PLACEMENT  2010  . NASAL SINUS SURGERY      Home Medications:    Medication List       Accurate as of 11/17/16  2:34 PM. Always use your most recent med list.          aspirin 81 MG tablet Take 81 mg by mouth daily.   clopidogrel 75 MG tablet Commonly known as:  PLAVIX Take by mouth.   Co-Enzyme Q-10 30 MG Caps Take by mouth.   ferrous sulfate 325 (65 FE) MG tablet Take by mouth.   FISH OIL PO Take by mouth.   glucosamine-chondroitin 500-400 MG tablet Take 1 tablet by mouth 3 (three) times daily.   lisinopril 5 MG tablet Commonly known as:  PRINIVIL,ZESTRIL Take by mouth.   metoprolol succinate 25 MG 24 hr tablet Commonly known as:  TOPROL-XL Take by mouth.   nitroGLYCERIN 0.4 MG SL tablet Commonly known as:  NITROSTAT Place under the tongue.   pantoprazole 40 MG tablet  Commonly known as:  PROTONIX Take by mouth.   rosuvastatin 5 MG tablet Commonly known as:  CRESTOR Take by mouth.       Allergies:  Allergies  Allergen Reactions  . Cefaclor Other (See Comments)  . Aspirin Rash  . Keflex [Cephalexin] Rash  . Other Rash    ASPRIN. ASPRIN  . Penicillins Rash    Family History: Family History  Problem Relation Age of Onset  . Heart disease Father   . Prostate cancer Brother     Social History:  reports that he has never smoked. He has never used smokeless tobacco. He reports that he drinks alcohol. He reports that he does not use drugs.  ROS: UROLOGY Frequent Urination?: Yes Hard to postpone urination?: Yes Burning/pain with urination?: No Get up at night to  urinate?: Yes Leakage of urine?: No Urine stream starts and stops?: No Trouble starting stream?: No Do you have to strain to urinate?: No Blood in urine?: No Urinary tract infection?: No Sexually transmitted disease?: No Injury to kidneys or bladder?: No Painful intercourse?: No Weak stream?: No Erection problems?: No Penile pain?: No  Gastrointestinal Nausea?: No Vomiting?: No Indigestion/heartburn?: No Diarrhea?: No Constipation?: No  Constitutional Fever: No Night sweats?: No Weight loss?: No Fatigue?: No  Skin Skin rash/lesions?: No Itching?: No  Eyes Blurred vision?: No Double vision?: No  Ears/Nose/Throat Sore throat?: No Sinus problems?: No  Hematologic/Lymphatic Swollen glands?: No Easy bruising?: No  Cardiovascular Leg swelling?: No Chest pain?: No  Respiratory Cough?: No Shortness of breath?: No  Endocrine Excessive thirst?: No  Musculoskeletal Back pain?: No Joint pain?: Yes  Neurological Headaches?: No Dizziness?: No  Psychologic Depression?: No Anxiety?: No  Physical Exam: BP 121/77 (BP Location: Left Arm, Patient Position: Sitting, Cuff Size: Large)   Pulse 77   Ht 6\' 2"  (1.88 m)   Wt 247 lb (112 kg)   BMI 31.71 kg/m   Constitutional:  Alert and oriented, No acute distress.  Accompanied by wife today. HEENT: [place] AT, moist mucus membranes.  Trachea midline, no masses. Cardiovascular: No clubbing, cyanosis, or edema. GI: Abdomen is soft, nontender, nondistended, no abdominal masses Skin: No rashes, bruises or suspicious lesions. Neurologic: Grossly intact, no focal deficits, moving all 4 extremities. Psychiatric: Normal mood and affect.  Laboratory Data: Lab Results  Component Value Date   CREATININE 0.90 03/14/2016    Urinalysis    Component Value Date/Time   APPEARANCEUR Clear 03/20/2016 0832   GLUCOSEU Negative 03/20/2016 0832   BILIRUBINUR Negative 03/20/2016 0832   PROTEINUR Negative 03/20/2016 0832    NITRITE Negative 03/20/2016 0832   LEUKOCYTESUR Negative 03/20/2016 0832     Assessment & Plan:   60 year old male with low-risk Gleason 3+3, cT1c prostate cancer involving 2/12 cores, PSA 7.  The patient was counseled about the natural history of prostate cancer and the standard treatment options that are available for prostate cancer. It was explained to him how his age and life expectancy, clinical stage, Gleason score, and PSA affect his prognosis, the decision to proceed with additional staging studies, as well as how that information influences recommended treatment strategies. We discussed the roles for active surveillance, radiation therapy, surgical therapy, androgen deprivation, as well as ablative therapy options for the treatment of prostate cancer as appropriate to his individual cancer situation. We discussed the risks and benefits of these options with regard to their impact on cancer control and also in terms of potential adverse events, complications, and impact on quality of life  particularly related to urinary, bowel, and sexual function. The patient was encouraged to ask questions throughout the discussion today and all questions were answered to his stated satisfaction. In addition, the patient was providedwith and/or directed to appropriate resources and literature for further education about prostate cancer treatment options.  We discussed surgical therapy for prostate cancer including the different available surgical approaches. We discussed, in detail, the risks and expectations of surgery with regard to cancer control, urinary control, and erectile dysfunction as well as expected post operative cover he processed. Additional risks of surgery including but not limalited to bleeding, infection, hernia formation, nerve damage, steel formation, bowel/rect injury, potentially necessitating colostomy, damage to the urinary tract resulting in urinary leakage, urethral stricture, and  cardiopulmonary risk such as myocardial infarction, stroke, death, thromboembolism etc. were explained. The risk of open surgical conversion for robotics/laparoscopic prostatectomy is also discussed.  In general, given that he falls into the low risk category, I believe that active surveillance is a reasonable approach with very close follow up.  I would highly recommend consideration of an endorectal MRI in 6 months followed by biopsy, either fusion or ultrasound-guided pending the MRI results. This appeared be to ensure that no intermediate or high risk cancer was missed, especially given his gland volume.    He would like to proceed with active surveillance. I will have him return in 3 months with a PSA/DRE. Always cautions were answered today.  Return in about 3 months (around 02/15/2017) for DRE/ DRE.  Hollice Espy, MD  [facility] [address], [place], [place] [address] [phone]  I spent 25 min with this patient of which greater than 50% was spent in counseling and coordination of care with the patient.

## 2017-02-15 ENCOUNTER — Other Ambulatory Visit: Payer: Self-pay

## 2017-02-15 DIAGNOSIS — C61 Malignant neoplasm of prostate: Secondary | ICD-10-CM

## 2017-02-16 ENCOUNTER — Encounter: Payer: Self-pay | Admitting: Urology

## 2017-02-16 ENCOUNTER — Ambulatory Visit (INDEPENDENT_AMBULATORY_CARE_PROVIDER_SITE_OTHER): Payer: BLUE CROSS/BLUE SHIELD | Admitting: Urology

## 2017-02-16 ENCOUNTER — Other Ambulatory Visit
Admission: RE | Admit: 2017-02-16 | Discharge: 2017-02-16 | Disposition: A | Discharge status: Home / Self Care | Payer: BLUE CROSS/BLUE SHIELD | Source: Ambulatory Visit | Attending: Urology | Admitting: Urology

## 2017-02-16 VITALS — BP 143/76 | HR 76 | Ht 74.0 in | Wt 241.0 lb

## 2017-02-16 DIAGNOSIS — C61 Malignant neoplasm of prostate: Secondary | ICD-10-CM | POA: Diagnosis not present

## 2017-02-16 NOTE — Progress Notes (Signed)
02/16/2017 5:11 PM   Kenneth Mayo July 19, 1956 [id number]  Referring provider: Tracie Harrier, MD [address] [facility] [place], [place] [address]  Chief Complaint  Patient presents with  . Prostate Cancer    23month    HPI: 61 year old male with Gleason 3+3 prostate cancer, low risk, T1c dx 09/2016 on active surveillance who returns today for PSA/DRE.  Patient with a family history of prostate cancer. His brother is 10 months older and underwent radiation therapy (sounds like brachytherapy).   PSA history:  02/2014 PSA 3.72 02/2015 PSA 5.66 03/24/15 PSA 3.2 04/01/2015 4K score 93% probability of NOT having aggressive prostate cancer on biopsy 08/2015 PSA 3.7 with normal DRE 02/2016 prostate 75 g on CT  08/2016 PSA 7 with a 25% free  Oct 2017 PSA 7, prostate 67 grams, T1c (subtle induration on left but this was benign) Gleason 3+3=6 left apex 1% Gleason 3+3=6 right mid 92%   He has a history of BPH on surveillance. I-PSS 9 QOL 2  He does report some very mild erectile dysfunction which he attributes to his cardiac medications. These include slightly less full erection and occasional difficulty maintaining although this is rare.    PMH: Past Medical History:  Diagnosis Date  . Arrhythmia   . CAD (coronary artery disease)   . Elevated PSA   . Heart attack     Surgical History: Past Surgical History:  Procedure Laterality Date  . CORONARY ANGIOPLASTY WITH STENT PLACEMENT  2010  . NASAL SINUS SURGERY      Home Medications:  Allergies as of 02/16/2017      Reactions   Cefaclor Other (See Comments)   Aspirin Rash   Keflex [cephalexin] Rash   Other Rash   ASPRIN. ASPRIN   Penicillins Rash      Medication List       Accurate as of 02/16/17  5:11 PM. Always use your most recent med list.          aspirin 81 MG tablet Take 81 mg by mouth daily.   clopidogrel 75 MG tablet Commonly known as:  PLAVIX Take by mouth.   Co-Enzyme Q-10 30 MG  Caps Take by mouth.   ferrous sulfate 325 (65 FE) MG tablet Take by mouth.   FISH OIL PO Take by mouth.   glucosamine-chondroitin 500-400 MG tablet Take 1 tablet by mouth 3 (three) times daily.   lisinopril 5 MG tablet Commonly known as:  PRINIVIL,ZESTRIL Take by mouth.   metoprolol succinate 25 MG 24 hr tablet Commonly known as:  TOPROL-XL Take by mouth.   nitroGLYCERIN 0.4 MG SL tablet Commonly known as:  NITROSTAT Place under the tongue.   pantoprazole 40 MG tablet Commonly known as:  PROTONIX Take by mouth.   rosuvastatin 5 MG tablet Commonly known as:  CRESTOR Take by mouth.       Allergies:  Allergies  Allergen Reactions  . Cefaclor Other (See Comments)  . Aspirin Rash  . Keflex [Cephalexin] Rash  . Other Rash    ASPRIN. ASPRIN  . Penicillins Rash    Family History: Family History  Problem Relation Age of Onset  . Heart disease Father   . Prostate cancer Brother     Social History:  reports that he has never smoked. He has never used smokeless tobacco. He reports that he drinks alcohol. He reports that he does not use drugs.  ROS: UROLOGY Frequent Urination?: Yes Hard to postpone urination?: No Burning/pain with urination?:  No Get up at night to urinate?: No Leakage of urine?: No Urine stream starts and stops?: No Trouble starting stream?: No Do you have to strain to urinate?: No Blood in urine?: No Urinary tract infection?: No Sexually transmitted disease?: No Injury to kidneys or bladder?: No Painful intercourse?: No Weak stream?: No Erection problems?: No Penile pain?: No  Gastrointestinal Nausea?: No Vomiting?: No Indigestion/heartburn?: No Diarrhea?: No Constipation?: No  Constitutional Fever: No Night sweats?: No Weight loss?: No Fatigue?: No  Skin Skin rash/lesions?: No Itching?: No  Eyes Blurred vision?: No Double vision?: No  Ears/Nose/Throat Sore throat?: No Sinus problems?:  Yes  Hematologic/Lymphatic Swollen glands?: No Easy bruising?: No  Cardiovascular Leg swelling?: No Chest pain?: No  Respiratory Cough?: No Shortness of breath?: No  Endocrine Excessive thirst?: No  Musculoskeletal Back pain?: No Joint pain?: Yes  Neurological Headaches?: Yes Dizziness?: No  Psychologic Depression?: No Anxiety?: No  Physical Exam: BP (!) 143/76   Pulse 76   Ht 6\' 2"  (1.88 m)   Wt 241 lb (109.3 kg)   BMI 30.94 kg/m   Constitutional:  Alert and oriented, No acute distress.  Accompanied by wife today. HEENT: [name], moist mucus membranes.  Trachea midline, no masses. Cardiovascular: No clubbing, cyanosis, or edema. GI: Abdomen is soft, nontender, nondistended, no abdominal masses Rectal: Normal sphincter tone, 50+ cc prostate, no nodules, nontender. Skin: No rashes, bruises or suspicious lesions. Neurologic: Grossly intact, no focal deficits, moving all 4 extremities. Psychiatric: Normal mood and affect.  Laboratory Data: Lab Results  Component Value Date   CREATININE 0.90 03/14/2016    Urinalysis    Component Value Date/Time   APPEARANCEUR Clear 03/20/2016 0832   GLUCOSEU Negative 03/20/2016 0832   BILIRUBINUR Negative 03/20/2016 0832   PROTEINUR Negative 03/20/2016 0832   NITRITE Negative 03/20/2016 0832   LEUKOCYTESUR Negative 03/20/2016 0832     Assessment & Plan:   61 year old male dx 10/17 with low-risk Gleason 3+3, cT1c prostate cancer involving 2/12 cores, PSA 7.  1. Prostate cancer (HCC) PSA repeated today, will call with results DRE unremarkable We'll likely recommend prostate MRI versus repeat biopsy prior to one year as confirmatory study, we will discuss further next visit - PSA; Future   Return in about 4 months (around 06/18/2017) for PSA/ DRE.  Kenneth Espy, MD  [facility] [address] [place], [place] [address] [phone]  I spent 25 min with this patient of which  greater than 50% was spent in counseling and coordination of care with the patient.

## 2017-02-17 LAB — PSA: PSA: 4.1 ng/mL — ABNORMAL HIGH (ref 0.00–4.00)

## 2017-02-20 ENCOUNTER — Telehealth: Payer: Self-pay

## 2017-02-20 NOTE — Telephone Encounter (Signed)
LMOM

## 2017-02-20 NOTE — Telephone Encounter (Signed)
-----   Message from Hollice Espy, MD sent at 02/19/2017  7:54 PM EST ----- PSA 4.10 which is near your baseline.  Plan to recheck just prior to your next visit as discussed.  Hollice Espy, MD

## 2017-02-20 NOTE — Telephone Encounter (Signed)
Spoke with pt in reference to PSA results. Pt voiced understanding.  

## 2017-06-26 ENCOUNTER — Other Ambulatory Visit
Admission: RE | Admit: 2017-06-26 | Discharge: 2017-06-26 | Disposition: A | Discharge status: Home / Self Care | Payer: BLUE CROSS/BLUE SHIELD | Source: Ambulatory Visit | Attending: Urology | Admitting: Urology

## 2017-06-26 DIAGNOSIS — C61 Malignant neoplasm of prostate: Secondary | ICD-10-CM | POA: Insufficient documentation

## 2017-06-26 LAB — PSA: Prostatic Specific Antigen: 4.68 ng/mL — ABNORMAL HIGH (ref 0.00–4.00)

## 2017-06-29 ENCOUNTER — Ambulatory Visit: Payer: BLUE CROSS/BLUE SHIELD | Admitting: Urology

## 2017-07-06 ENCOUNTER — Encounter: Payer: Self-pay | Admitting: Urology

## 2017-07-06 ENCOUNTER — Ambulatory Visit (INDEPENDENT_AMBULATORY_CARE_PROVIDER_SITE_OTHER): Payer: BLUE CROSS/BLUE SHIELD | Admitting: Urology

## 2017-07-06 VITALS — BP 137/92 | HR 88 | Ht 74.0 in | Wt 247.0 lb

## 2017-07-06 DIAGNOSIS — C61 Malignant neoplasm of prostate: Secondary | ICD-10-CM

## 2017-07-06 NOTE — Progress Notes (Signed)
07/06/2017 4:36 PM   Kenneth Mayo January 29, 1956 [id number]  Referring provider: Tracie Harrier, MD [address] [facility] [place], [place] [address]  Chief Complaint  Patient presents with  . Prostate Cancer    4 month follow up     HPI: 61 year old male with Gleason 3+3 prostate cancer, low risk, T1c dx 09/2016 on active surveillance who returns today for PSA/DRE.     Patient with a family history of prostate cancer. His brother is 10 months older and underwent radiation therapy (sounds like brachytherapy).    PSA history:  02/2014 PSA 3.72 02/2015 PSA 5.66 03/24/15 PSA 3.2 04/01/2015 4K score 93% probability of NOT having aggressive prostate cancer on biopsy 08/2015 PSA 3.7 with normal DRE 02/2016 prostate 75 g on CT  08/2016 PSA 7 with a 25% free 10/ 2017 PSA 7, prostate 67 grams, T1c (subtle induration on left but this was benign) Gleason 3+3=6 left apex 1% Gleason 3+3=6 right mid 92%  02/2017 4.10 06/2017 4.68   He has a history of BPH on surveillance. I-PSS 9 QOL 2  He does report some very mild erectile dysfunction which he attributes to his cardiac medications. These include slightly less full erection and occasional difficulty maintaining although this is rare.  PMH: Past Medical History:  Diagnosis Date  . Arrhythmia   . CAD (coronary artery disease)   . Elevated PSA   . Heart attack [facility])     Surgical History: Past Surgical History:  Procedure Laterality Date  . CORONARY ANGIOPLASTY WITH STENT PLACEMENT  2010  . KNEE SURGERY Right 1998  . NASAL SINUS SURGERY      Home Medications:  Allergies as of 07/06/2017      Reactions   Cefaclor Other (See Comments)   Aspirin Rash   Keflex [cephalexin] Rash   Other Rash   ASPRIN. ASPRIN   Penicillins Rash      Medication List       Accurate as of 07/06/17  4:36 PM. Always use your most recent med list.          aspirin 81 MG tablet Take 81 mg by mouth daily.   clopidogrel 75 MG  tablet Commonly known as:  PLAVIX Take by mouth.   Co-Enzyme Q-10 30 MG Caps Take by mouth.   ferrous sulfate 325 (65 FE) MG tablet Take by mouth.   FISH OIL PO Take by mouth.   glucosamine-chondroitin 500-400 MG tablet Take 1 tablet by mouth 3 (three) times daily.   lisinopril 5 MG tablet Commonly known as:  PRINIVIL,ZESTRIL Take by mouth.   metoprolol succinate 25 MG 24 hr tablet Commonly known as:  TOPROL-XL Take by mouth.   nitroGLYCERIN 0.4 MG SL tablet Commonly known as:  NITROSTAT Place under the tongue.   pantoprazole 40 MG tablet Commonly known as:  PROTONIX Take by mouth.   rosuvastatin 5 MG tablet Commonly known as:  CRESTOR Take by mouth. Patient states takes on Monday, Wednesday, and Friday       Allergies:  Allergies  Allergen Reactions  . Cefaclor Other (See Comments)  . Aspirin Rash  . Keflex [Cephalexin] Rash  . Other Rash    ASPRIN. ASPRIN  . Penicillins Rash    Family History: Family History  Problem Relation Age of Onset  . Heart disease Father   . Prostate cancer Brother   . Kidney cancer Neg Hx   . Bladder Cancer Neg Hx     Social History:  reports  that he has never smoked. He has never used smokeless tobacco. He reports that he drinks alcohol. He reports that he does not use drugs.  ROS: UROLOGY Frequent Urination?: Yes Hard to postpone urination?: Yes Burning/pain with urination?: No Get up at night to urinate?: Yes Leakage of urine?: No Urine stream starts and stops?: Yes Trouble starting stream?: No Do you have to strain to urinate?: No Blood in urine?: No Urinary tract infection?: No Sexually transmitted disease?: No Injury to kidneys or bladder?: No Painful intercourse?: No Weak stream?: No Erection problems?: No Penile pain?: No  Gastrointestinal Nausea?: No Vomiting?: No Indigestion/heartburn?: No Diarrhea?: No Constipation?: No  Constitutional Fever: No Night sweats?: No Weight loss?:  No Fatigue?: No  Skin Skin rash/lesions?: No Itching?: No  Eyes Blurred vision?: No Double vision?: No  Ears/Nose/Throat Sore throat?: No Sinus problems?: No  Hematologic/Lymphatic Swollen glands?: No Easy bruising?: No  Cardiovascular Leg swelling?: No Chest pain?: No  Respiratory Cough?: No Shortness of breath?: No  Endocrine Excessive thirst?: No  Musculoskeletal Back pain?: No Joint pain?: No  Neurological Headaches?: No Dizziness?: No  Psychologic Depression?: No Anxiety?: No  Physical Exam: BP (!) 137/92   Pulse 88   Ht 6\' 2"  (1.88 m)   Wt 247 lb (112 kg)   BMI 31.71 kg/m   Constitutional:  Alert and oriented, No acute distress.  Accompanied by wife today. HEENT: [place] AT, moist mucus membranes.  Trachea midline, no masses. Cardiovascular: No clubbing, cyanosis, or edema. GI: Abdomen is soft, nontender, nondistended, no abdominal masses Rectal: Normal sphincter tone, 50+ cc prostate, no nodules, nontender. Skin: No rashes, bruises or suspicious lesions. Neurologic: Grossly intact, no focal deficits, moving all 4 extremities. Psychiatric: Normal mood and affect.  Exam unchanged from previous visit  Laboratory Data: Lab Results  Component Value Date   CREATININE 0.90 03/14/2016    Urinalysis    Component Value Date/Time   APPEARANCEUR Clear 03/20/2016 0832   GLUCOSEU Negative 03/20/2016 0832   BILIRUBINUR Negative 03/20/2016 0832   PROTEINUR Negative 03/20/2016 0832   NITRITE Negative 03/20/2016 0832   LEUKOCYTESUR Negative 03/20/2016 0832     Assessment & Plan:   61 year old male dx 10/17 with low-risk Gleason 3+3, cT1c prostate cancer involving 2/12 cores, PSA 7.  1. Prostate cancer (HCC) PSA essentially stable As previously discussed, we'll pursue confirmatory biopsy in 10/2017 vs endorectal MRI He would like to proceed with biopsy, reviewed instructions, will need to hold aspirin and Plavix Previously received cardiac clearance  to hold these medications, no cardiac events since previous clearance All questions answered Recommend PSA 1 week prior biopsy - PSA; Future   Return in about 1 year (around 07/06/2018) for Nov 2018 prostate biopsy.  Hollice Espy, MD  [facility] [phone]

## 2017-09-12 ENCOUNTER — Other Ambulatory Visit: Payer: Self-pay

## 2017-09-12 DIAGNOSIS — C61 Malignant neoplasm of prostate: Secondary | ICD-10-CM

## 2017-09-20 DIAGNOSIS — I214 Non-ST elevation (NSTEMI) myocardial infarction: Secondary | ICD-10-CM | POA: Insufficient documentation

## 2017-10-16 ENCOUNTER — Other Ambulatory Visit: Payer: BLUE CROSS/BLUE SHIELD

## 2017-10-16 DIAGNOSIS — C61 Malignant neoplasm of prostate: Secondary | ICD-10-CM

## 2017-10-17 LAB — PSA: Prostate Specific Ag, Serum: 7.1 ng/mL — ABNORMAL HIGH (ref 0.0–4.0)

## 2017-10-23 ENCOUNTER — Other Ambulatory Visit: Payer: Self-pay | Admitting: Urology

## 2017-10-23 ENCOUNTER — Ambulatory Visit: Payer: BLUE CROSS/BLUE SHIELD | Admitting: Urology

## 2017-10-23 ENCOUNTER — Encounter: Payer: Self-pay | Admitting: Urology

## 2017-10-23 VITALS — BP 129/72 | HR 98 | Ht 74.0 in | Wt 246.0 lb

## 2017-10-23 DIAGNOSIS — C61 Malignant neoplasm of prostate: Secondary | ICD-10-CM

## 2017-10-23 MED ORDER — GENTAMICIN SULFATE 40 MG/ML IJ SOLN: 80.0000 mg | Freq: Once | INTRAMUSCULAR | Status: DC

## 2017-10-23 MED ORDER — LEVOFLOXACIN 500 MG PO TABS
500.0000 mg | ORAL_TABLET | Freq: Once | ORAL | Status: AC
  Administered 2017-10-23: 500 mg via ORAL

## 2017-10-23 MED ORDER — LIDOCAINE HCL 2 % EX GEL
1.0000 "application " | Freq: Once | CUTANEOUS | Status: AC
  Administered 2017-10-23: 1 via URETHRAL

## 2017-10-23 NOTE — Progress Notes (Signed)
   10/23/17  CC:  Chief Complaint  Patient presents with  . Prostate Biopsy    HPI: 61 year old male with Gleason 3+3 prostate cancer, low risk, T1c dx 09/2016 on active surveillance who returns today for prostate biopsy.  Patient with a family history of prostate cancer. His brother is 10 months older and underwent radiation therapy (sounds like brachytherapy).    PSA history:  02/2014 PSA 3.72 02/2015 PSA 5.66 03/24/15 PSA 3.2 04/01/2015 4K score 93% probability of NOT having aggressive prostate cancer on biopsy 08/2015 PSA 3.7 with normal DRE 02/2016 prostate 75 g on CT  08/2016 PSA 7 with a 25% free 10/ 2017 PSA 7, prostate 67 grams, T1c (subtle induration on left but this was benign) Gleason 3+3=6 left apex 1% Gleason 3+3=6 right mid 92%  02/2017 4.10 06/2017 4.68 09/2017 7.1   Blood pressure 129/72, pulse 98, height 6\' 2"  (1.88 m), weight 246 lb (111.6 kg). NED. A&Ox3.   No respiratory distress   Abd soft, NT, ND Normal sphincter tone  Prostate Biopsy Procedure   Informed consent was obtained after discussing risks/benefits of the procedure.  A time out was performed to ensure correct patient identity.  Pre-Procedure: - Last PSA Level:  Lab Results  Component Value Date   PSA 4.10 (H) 02/16/2017   - Gentamicin given prophylactically - Levaquin 500 mg administered PO -Transrectal Ultrasound performed revealing a 67.8 gm prostate -No median lobe noted but notable for multiple small hypoechoic lesions bilatearlly  Procedure: - Prostate block performed using 10 cc 1% lidocaine and biopsies taken from sextant areas, a total of 12 under ultrasound guidance.  Post-Procedure: - Patient tolerated the procedure well - He was counseled to seek immediate medical attention if experiences any severe pain, significant bleeding, or fevers - Return in one week to discuss biopsy results  Assessment/ Plan:  1. Prostate cancer ([place]) As above - levofloxacin (LEVAQUIN) tablet  500 mg - lidocaine (XYLOCAINE) 2 % jelly 1 application - gentamicin (GARAMYCIN) injection 80 mg    Return in about 2 weeks (around 11/06/2017) for review biopsy results.  Hollice Espy, MD

## 2017-10-27 LAB — PATHOLOGY REPORT

## 2017-10-29 ENCOUNTER — Other Ambulatory Visit: Payer: Self-pay | Admitting: Urology

## 2017-10-30 ENCOUNTER — Telehealth: Payer: Self-pay

## 2017-10-30 DIAGNOSIS — C61 Malignant neoplasm of prostate: Secondary | ICD-10-CM

## 2017-10-30 NOTE — Telephone Encounter (Signed)
Spoke with pt in reference to prostate bx results. Made aware Dr. Erlene Quan would like to see in 32mo with PSA. Pt voiced understanding. OV and lab appts made. Orders placed.

## 2017-10-30 NOTE — Telephone Encounter (Signed)
-----   Message from Hollice Espy, MD sent at 10/29/2017  3:16 PM EST ----- Please let this patient know that his prostate biopsy results look about the same as last time.  He continues to have low risk disease involving only 4 of 12 cores which are all relatively low volume.  I recommend continued surveillance.  I would like to see him in 6 months with PSA/DRE.  If he would like to come in and discuss it, he can keep his follow-up appointment as scheduled.  Otherwise rescheduled for in 6 months.  This is good news.  Hollice Espy, MD

## 2017-11-06 DIAGNOSIS — C61 Malignant neoplasm of prostate: Secondary | ICD-10-CM | POA: Insufficient documentation

## 2017-11-13 ENCOUNTER — Ambulatory Visit: Payer: Self-pay | Admitting: Urology

## 2018-04-25 ENCOUNTER — Other Ambulatory Visit
Admission: RE | Admit: 2018-04-25 | Discharge: 2018-04-25 | Disposition: A | Discharge status: Home / Self Care | Payer: BLUE CROSS/BLUE SHIELD | Source: Ambulatory Visit | Attending: Urology | Admitting: Urology

## 2018-04-25 DIAGNOSIS — C61 Malignant neoplasm of prostate: Secondary | ICD-10-CM | POA: Diagnosis not present

## 2018-04-25 LAB — PSA: Prostatic Specific Antigen: 6 ng/mL — ABNORMAL HIGH (ref 0.00–4.00)

## 2018-04-25 NOTE — Addendum Note (Signed)
Addended by: Janece Canterbury on: 04/25/2018 08:47 AM   Modules accepted: Orders

## 2018-04-29 ENCOUNTER — Encounter: Payer: Self-pay | Admitting: Urology

## 2018-04-29 ENCOUNTER — Other Ambulatory Visit: Payer: Self-pay

## 2018-05-01 ENCOUNTER — Ambulatory Visit (INDEPENDENT_AMBULATORY_CARE_PROVIDER_SITE_OTHER): Payer: BLUE CROSS/BLUE SHIELD | Admitting: Urology

## 2018-05-01 ENCOUNTER — Encounter: Payer: Self-pay | Admitting: Urology

## 2018-05-01 VITALS — BP 144/94 | HR 97 | Ht 74.0 in | Wt 261.0 lb

## 2018-05-01 DIAGNOSIS — C61 Malignant neoplasm of prostate: Secondary | ICD-10-CM | POA: Diagnosis not present

## 2018-05-01 NOTE — Progress Notes (Signed)
05/01/2018 9:37 AM   Kenneth Mayo 1956-09-06 [id number]  Referring provider: Tracie Harrier, MD [address] [facility] [place], [place] [address]  Chief Complaint  Patient presents with  . Prostate Cancer    HPI: 62 year old male with Gleason 3+3 prostate cancer, low risk, T1c dx 09/2016 s/p recent repeat biopsy on 10/2017 showing similar overall disease burden despite more elevated PSA.  PSA trend as below.  He returns today for six-month follow-up.  Patient with a family history of prostate cancer. His brother is 10 months older and underwent radiation therapy (sounds like brachytherapy).   Today, he is doing well.  He does have some occasional urinary frequency but this is not bothersome to him.  No other significant voiding symptoms.  He is not on any BPH meds.  PSA history:  02/2014 PSA 3.72 02/2015 PSA 5.66 03/24/15 PSA 3.2 04/01/2015 4K score 93% probability of NOT having aggressive prostate cancer on biopsy 08/2015 PSA 3.7 with normal DRE 02/2016 prostate 75 g on CT  08/2016 PSA 7 with a 25% free 09/2016 PSA 7, --> PNBX prostate 67 grams, T1c (subtle induration on left but this was benign) Gleason 3+3=6 left apex 1% Gleason 3+3=6 right mid 92% 02/2017 4.10 06/2017 4.68 09/2017 7.1  --> PNBX Gleason 3+3 involving 4/12 cores bilaterally, low volume diease, TRUS vol 67.8 04/2018  6.00   PMH: Past Medical History:  Diagnosis Date  . Arrhythmia   . CAD (coronary artery disease)   . Elevated PSA   . Heart attack [facility])     Surgical History: Past Surgical History:  Procedure Laterality Date  . CORONARY ANGIOPLASTY WITH STENT PLACEMENT  2010  . KNEE SURGERY Right 1998  . NASAL SINUS SURGERY      Home Medications:  Allergies as of 05/01/2018      Reactions   Cefaclor Other (See Comments)   Aspirin Rash   Keflex [cephalexin] Rash   Other Rash   ASPRIN. ASPRIN   Penicillins Rash      Medication List        Accurate as of 05/01/18  9:37  AM. Always use your most recent med list.          aspirin 81 MG tablet Take 81 mg by mouth daily.   clopidogrel 75 MG tablet Commonly known as:  PLAVIX Take by mouth.   Co-Enzyme Q-10 30 MG Caps Take by mouth.   ferrous sulfate 325 (65 FE) MG tablet Take by mouth.   FISH OIL PO Take by mouth.   glucosamine-chondroitin 500-400 MG tablet Take 1 tablet by mouth 3 (three) times daily.   lisinopril 5 MG tablet Commonly known as:  PRINIVIL,ZESTRIL Take by mouth.   metoprolol succinate 25 MG 24 hr tablet Commonly known as:  TOPROL-XL Take by mouth.   nitroGLYCERIN 0.4 MG SL tablet Commonly known as:  NITROSTAT Place under the tongue.   pantoprazole 40 MG tablet Commonly known as:  PROTONIX Take by mouth.       Allergies:  Allergies  Allergen Reactions  . Cefaclor Other (See Comments)  . Aspirin Rash  . Keflex [Cephalexin] Rash  . Other Rash    ASPRIN. ASPRIN  . Penicillins Rash    Family History: Family History  Problem Relation Age of Onset  . Heart disease Father   . Prostate cancer Brother   . Kidney cancer Neg Hx   . Bladder Cancer Neg Hx     Social History:  reports that he  has never smoked. He has never used smokeless tobacco. He reports that he drinks alcohol. He reports that he does not use drugs.  ROS: UROLOGY Frequent Urination?: Yes Hard to postpone urination?: No Burning/pain with urination?: No Get up at night to urinate?: No Leakage of urine?: No Urine stream starts and stops?: No Trouble starting stream?: No Do you have to strain to urinate?: No Blood in urine?: No Urinary tract infection?: No Sexually transmitted disease?: No Injury to kidneys or bladder?: No Painful intercourse?: No Weak stream?: No Erection problems?: No Penile pain?: No  Gastrointestinal Nausea?: No Vomiting?: No Indigestion/heartburn?: No Diarrhea?: No Constipation?: No  Constitutional Fever: No Night sweats?: No Weight loss?: No Fatigue?:  No  Skin Skin rash/lesions?: No Itching?: No  Eyes Blurred vision?: No Double vision?: No  Ears/Nose/Throat Sore throat?: No Sinus problems?: No  Hematologic/Lymphatic Swollen glands?: No Easy bruising?: No  Cardiovascular Leg swelling?: No Chest pain?: No  Respiratory Cough?: No Shortness of breath?: No  Endocrine Excessive thirst?: No  Musculoskeletal Back pain?: No Joint pain?: Yes  Neurological Headaches?: No Dizziness?: No  Psychologic Depression?: No Anxiety?: No  Physical Exam: BP (!) 144/94 (BP Location: Left Arm, Patient Position: Sitting, Cuff Size: Large)   Pulse 97   Ht 6\' 2"  (1.88 m)   Wt 261 lb (118.4 kg)   BMI 33.51 kg/m   Constitutional:  Alert and oriented, No acute distress. HEENT: [place] AT, moist mucus membranes.  Trachea midline, no masses. Cardiovascular: No clubbing, cyanosis, or edema. Respiratory: Normal respiratory effort, no increased work of breathing. Rectal: Normal sphincter tone.  50+ cc prostate, nontender, no nodules. GU: No CVA tenderness Skin: No rashes, bruises or suspicious lesions. Neurologic: Grossly intact, no focal deficits, moving all 4 extremities. Psychiatric: Normal mood and affect.  Laboratory Data: PSA trend as above  Urinalysis N/a  Pertinent Imaging: N/a  Assessment & Plan:    1. Prostate cancer ([place]) Low risk prostate cancer status post recent repeat biopsy 10/2017 confirming low volume low risk disease PSA trending back down towards baseline which is reassuring We will continue active surveillance at this point in time, repeat PSA in 6 months If PSA rises again, will consider endorectal MRI as alternative treatment biopsy in the setting of prostamegaly   Return in about 6 months (around 11/01/2018) for PSA/ DRE.  Hollice Espy, MD  [facility] [address], [place], [place] [address] [phone]

## 2018-08-28 ENCOUNTER — Emergency Department
Admission: EM | Admit: 2018-08-28 | Discharge: 2018-08-28 | Disposition: A | Discharge status: Home / Self Care | Payer: BLUE CROSS/BLUE SHIELD | Attending: Emergency Medicine | Admitting: Emergency Medicine

## 2018-08-28 ENCOUNTER — Encounter: Payer: Self-pay | Admitting: Intensive Care

## 2018-08-28 ENCOUNTER — Other Ambulatory Visit: Payer: Self-pay

## 2018-08-28 DIAGNOSIS — Z7982 Long term (current) use of aspirin: Secondary | ICD-10-CM | POA: Diagnosis not present

## 2018-08-28 DIAGNOSIS — Z8546 Personal history of malignant neoplasm of prostate: Secondary | ICD-10-CM | POA: Diagnosis not present

## 2018-08-28 DIAGNOSIS — R040 Epistaxis: Secondary | ICD-10-CM | POA: Insufficient documentation

## 2018-08-28 DIAGNOSIS — Z79899 Other long term (current) drug therapy: Secondary | ICD-10-CM | POA: Insufficient documentation

## 2018-08-28 DIAGNOSIS — I2510 Atherosclerotic heart disease of native coronary artery without angina pectoris: Secondary | ICD-10-CM | POA: Diagnosis not present

## 2018-08-28 HISTORY — DX: Malignant (primary) neoplasm, unspecified: C80.1

## 2018-08-28 LAB — CBC WITH DIFFERENTIAL/PLATELET
BASOS PCT: 1 %
Basophils Absolute: 0.1 10*3/uL (ref 0–0.1)
Eosinophils Absolute: 0.3 10*3/uL (ref 0–0.7)
Eosinophils Relative: 3 %
HEMATOCRIT: 43.7 % (ref 40.0–52.0)
HEMOGLOBIN: 15 g/dL (ref 13.0–18.0)
Lymphocytes Relative: 19 %
Lymphs Abs: 2 10*3/uL (ref 1.0–3.6)
MCH: 31.2 pg (ref 26.0–34.0)
MCHC: 34.4 g/dL (ref 32.0–36.0)
MCV: 90.9 fL (ref 80.0–100.0)
MONOS PCT: 7 %
Monocytes Absolute: 0.7 10*3/uL (ref 0.2–1.0)
NEUTROS ABS: 7.3 10*3/uL — AB (ref 1.4–6.5)
NEUTROS PCT: 70 %
Platelets: 209 10*3/uL (ref 150–440)
RBC: 4.81 MIL/uL (ref 4.40–5.90)
RDW: 13.6 % (ref 11.5–14.5)
WBC: 10.4 10*3/uL (ref 3.8–10.6)

## 2018-08-28 LAB — BASIC METABOLIC PANEL
ANION GAP: 7 (ref 5–15)
BUN: 12 mg/dL (ref 8–23)
CALCIUM: 8.8 mg/dL — AB (ref 8.9–10.3)
CHLORIDE: 107 mmol/L (ref 98–111)
CO2: 25 mmol/L (ref 22–32)
Creatinine, Ser: 0.89 mg/dL (ref 0.61–1.24)
GFR calc non Af Amer: >60 mL/min (ref >60)
Glucose, Bld: 115 mg/dL — ABNORMAL HIGH (ref 70–99)
Potassium: 3.8 mmol/L (ref 3.5–5.1)
Sodium: 139 mmol/L (ref 135–145)

## 2018-08-28 LAB — PROTIME-INR
INR: 1.02
Prothrombin Time: 13.3 seconds (ref 11.4–15.2)

## 2018-08-28 LAB — APTT: APTT: 31 s (ref 24–36)

## 2018-08-28 MED ORDER — OXYMETAZOLINE HCL 0.05 % NA SOLN
1.0000 | Freq: Once | NASAL | Status: DC
  Filled 2018-08-28: qty 15

## 2018-08-28 MED ORDER — SILVER NITRATE-POT NITRATE 75-25 % EX MISC
CUTANEOUS | Status: AC
  Filled 2018-08-28: qty 3

## 2018-08-28 MED ORDER — SILVER NITRATE-POT NITRATE 75-25 % EX MISC
3.0000 | Freq: Once | CUTANEOUS | Status: DC
  Filled 2018-08-28: qty 3

## 2018-08-28 MED ORDER — OXYMETAZOLINE HCL 0.05 % NA SOLN
NASAL | Status: AC
  Filled 2018-08-28: qty 15

## 2018-08-28 NOTE — ED Provider Notes (Signed)
[facility] Emergency Department Provider Note  Time seen: 7:07 PM  I have reviewed the triage vital signs and the nursing notes.   HISTORY  Chief Complaint No chief complaint on file.    HPI Kenneth Mayo is a 62 y.o. male with a past medical history of CAD, hypertension on Plavix who presents to the emergency department for nosebleed.  According to the patient over the past 3 weeks he has intermittently had a nosebleed from his right nostril.  States today it has been heavier, went to his primary care doctor who was unable to stop it so they sent him to the emergency department.  Patient states he did pack his nose and send him here, upon arrival patient's nose is not bleeding, hemostatic.  Denies any other symptoms.  Largely negative review of systems.   Past Medical History:  Diagnosis Date  . Arrhythmia   . CAD (coronary artery disease)   . Cancer [facility])    prostate cancer  . Elevated PSA   . Heart attack [facility])     Patient Active Problem List   Diagnosis Date Noted  . Preop cardiovascular exam 09/27/2016  . PSA elevation 09/22/2016  . BP (high blood pressure) 08/24/2015  . Anemia 08/26/2014  . Other and unspecified hyperlipidemia 08/26/2014  . Anemia, iron deficiency 08/26/2014  . Arteriosclerosis of coronary artery 01/11/2009    Past Surgical History:  Procedure Laterality Date  . CORONARY ANGIOPLASTY WITH STENT PLACEMENT  2010  . KNEE SURGERY Right 1998  . NASAL SINUS SURGERY      Prior to Admission medications   Medication Sig Start Date End Date Taking? Authorizing Provider  aspirin 81 MG tablet Take 81 mg by mouth daily.    [provider]  clopidogrel (PLAVIX) 75 MG tablet Take by mouth. 09/22/16   [provider]  Co-Enzyme Q-10 30 MG CAPS Take by mouth.    [provider]  ferrous sulfate 325 (65 FE) MG tablet Take by mouth. 03/23/16   [provider]  glucosamine-chondroitin 500-400 MG tablet Take 1  tablet by mouth 3 (three) times daily.    [provider]  lisinopril (PRINIVIL,ZESTRIL) 5 MG tablet Take by mouth. 09/22/16   [provider]  metoprolol succinate (TOPROL-XL) 25 MG 24 hr tablet Take by mouth. 03/23/16   [provider]  nitroGLYCERIN (NITROSTAT) 0.4 MG SL tablet Place under the tongue. 09/22/16 09/22/17  [provider]  Omega-3 Fatty Acids (FISH OIL PO) Take by mouth.    [provider]  pantoprazole (PROTONIX) 40 MG tablet Take by mouth. 09/22/16   [provider]    Allergies  Allergen Reactions  . Cefaclor Other (See Comments)  . Aspirin Rash  . Keflex [Cephalexin] Rash  . Other Rash    ASPRIN. ASPRIN  . Penicillins Rash    Family History  Problem Relation Age of Onset  . Heart disease Father   . Prostate cancer Brother   . Kidney cancer Neg Hx   . Bladder Cancer Neg Hx     Social History Social History   Tobacco Use  . Smoking status: Never Smoker  . Smokeless tobacco: Never Used  Substance Use Topics  . Alcohol use: Yes    Alcohol/week: 2.0 standard drinks    Types: 2 Cans of beer per week  . Drug use: No    Review of Systems Constitutional: Negative for LOC. ENT: Nosebleed intermittent x3 weeks from the right nostril worse today.  Gastrointestinal: Negative for vomiting Skin: Negative for other bleeding sites All other ROS negative  ____________________________________________   PHYSICAL EXAM:  VITAL SIGNS: ED Triage Vitals [08/28/18 1712]  Enc Vitals Group     BP (!) 143/87     Pulse Rate 88     Resp 16     Temp 98.1 F (36.7 C)     Temp Source Oral     SpO2 97 %     Weight 254 lb (115.2 kg)     Height 6\' 2"  (1.88 m)     Head Circumference      Peak Flow      Pain Score 0     Pain Loc      Pain Edu?      Excl. in [place]?     Constitutional: Alert and oriented. Well appearing and in no distress. Eyes: Normal exam ENT   Head: Normocephalic and atraumatic.   Nose: After  removing the packing the patient does have a small area on his right anterior nasal septum that appears suspicious for possible area of bleed.  With minimal agitation the area began bleeding once again.  Left nostril is clear.  No septal hematoma.   Mouth/Throat: Mucous membranes are moist. Cardiovascular: Normal rate, regular rhythm.  Respiratory: Normal respiratory effort without tachypnea nor retractions. Breath sounds are clear  Gastrointestinal: Soft and nontender. No distention.   Musculoskeletal: Nontender with normal range of motion in all extremities.  Neurologic:  Normal speech and language. No gross focal neurologic deficits Skin:  Skin is warm, dry and intact.  Psychiatric: Mood and affect are normal.   ____________________________________________   INITIAL IMPRESSION / ASSESSMENT AND PLAN / ED COURSE  Pertinent labs & imaging results that were available during my care of the patient were reviewed by me and considered in my medical decision making (see chart for details).  Patient presents to the emergency department for epistaxis of the right nostril.  On examination the patient does have an area of start on the anterior septum of the right nostril with minimal agitation that began bleeding fairly profusely once again.  We will attempt silver nitrate cauterization and Afrin spray.  Patient agreeable to plan of care.  Patient's lab work is reassuring including a normal INR lately count and blood levels.  Silver nitrate used for chemical cauterization with good success.  Monitored for approximately 30 minutes remain hemostatic we will discharge with Afrin and a nasal clamp to be used if needed.  Patient agreeable to plan of care.  ____________________________________________   FINAL CLINICAL IMPRESSION(S) / ED DIAGNOSES  Epistaxis    Harvest Dark, MD 08/28/18 1943

## 2018-08-28 NOTE — ED Triage Notes (Addendum)
Patient states "I have a nose bleed and Dr Carmie End sent me here to have my nose cauterized" Bleeding was happening from Right nostril. Patient has kleenex stuffed in Right nostril upon arrival to stop bleeding. Patient takes plavix daily. Patient states this has been going on awhile and using he can get bleeding to stop but this time he could not get it to stop bleeding.

## 2018-08-28 NOTE — ED Notes (Signed)
Afrin and silver nitrate sticks given to Dr. Kerman Passey.

## 2018-10-24 ENCOUNTER — Other Ambulatory Visit: Payer: Self-pay | Admitting: Family Medicine

## 2018-10-24 DIAGNOSIS — C61 Malignant neoplasm of prostate: Secondary | ICD-10-CM

## 2018-10-28 ENCOUNTER — Other Ambulatory Visit: Payer: BLUE CROSS/BLUE SHIELD

## 2018-10-28 ENCOUNTER — Encounter: Payer: Self-pay | Admitting: Urology

## 2018-10-30 ENCOUNTER — Other Ambulatory Visit: Payer: Self-pay

## 2018-10-30 ENCOUNTER — Ambulatory Visit (INDEPENDENT_AMBULATORY_CARE_PROVIDER_SITE_OTHER): Payer: BLUE CROSS/BLUE SHIELD | Admitting: Urology

## 2018-10-30 ENCOUNTER — Encounter: Payer: Self-pay | Admitting: Urology

## 2018-10-30 VITALS — BP 160/87 | HR 72 | Ht 74.0 in | Wt 251.0 lb

## 2018-10-30 DIAGNOSIS — C61 Malignant neoplasm of prostate: Secondary | ICD-10-CM

## 2018-10-30 NOTE — Progress Notes (Signed)
10/30/2018 8:45 AM   Kenneth Mayo February 03, 1956 [id number]  Referring provider: Tracie Harrier, MD [address] [facility] [place], [place] [address]  Chief Complaint  Patient presents with  . Prostate Cancer    follow up    HPI: 62 year old male with Gleason 3+3 prostate cancer who returns today for routine surveillance.  Not have his PSA drawn prior to today's appointment.  Rectal exam at last appointment 6 months ago unremarkable other than for prostamegaly.  PSA trend and biopsy history as per below.  He does have a family history of prostate cancer.  No changes in medical history other than some GI distress for which he is now taking probiotics.  No gross hematuria/ dysuria.  No weight loss or bone pain.  PSA history:  02/2014 PSA 3.72 02/2015 PSA 5.66 03/24/15 PSA 3.2 04/01/2015 4K score 93% probability of NOT having aggressive prostate cancer on biopsy 08/2015 PSA 3.7 with normal DRE 02/2016 prostate 75 g on CT  08/2016 PSA 7 with a 25% free 09/2016 PSA 7, --> PNBX prostate 67 grams, T1c (subtle induration on left but this was benign) Gleason 3+3=6 left apex 1% Gleason 3+3=6 right mid 92% 02/2017 4.10 06/2017 4.68 09/2017 7.1  --> PNBX Gleason 3+3 involving 4/12 cores bilaterally, low volume diease, TRUS vol 67.8 04/2018  6.00  PMH: Past Medical History:  Diagnosis Date  . Arrhythmia   . CAD (coronary artery disease)   . [facility])    prostate cancer  . Elevated PSA   . Heart attack [facility])     Surgical History: Past Surgical History:  Procedure Laterality Date  . CORONARY ANGIOPLASTY WITH STENT PLACEMENT  2010  . KNEE SURGERY Right 1998  . NASAL SINUS SURGERY      Home Medications:  Allergies as of 10/30/2018      Reactions   Cefaclor Other (See Comments)   Aspirin Rash   Keflex [cephalexin] Rash   Other Rash   ASPRIN. ASPRIN   Penicillins Rash      Medication List        Accurate as of 10/30/18  8:45 AM. Always use your  most recent med list.          aspirin 81 MG tablet Take 81 mg by mouth daily.   clopidogrel 75 MG tablet Commonly known as:  PLAVIX Take by mouth.   Co-Enzyme Q-10 30 MG Caps Take by mouth.   ferrous sulfate 325 (65 FE) MG tablet Take by mouth.   FISH OIL PO Take by mouth.   glucosamine-chondroitin 500-400 MG tablet Take 1 tablet by mouth 3 (three) times daily.   lisinopril 5 MG tablet Commonly known as:  PRINIVIL,ZESTRIL Take by mouth.   metoprolol succinate 25 MG 24 hr tablet Commonly known as:  TOPROL-XL Take by mouth.   nitroGLYCERIN 0.4 MG SL tablet Commonly known as:  NITROSTAT Place under the tongue.   pantoprazole 40 MG tablet Commonly known as:  PROTONIX Take by mouth.       Allergies:  Allergies  Allergen Reactions  . Cefaclor Other (See Comments)  . Aspirin Rash  . Keflex [Cephalexin] Rash  . Other Rash    ASPRIN. ASPRIN  . Penicillins Rash    Family History: Family History  Problem Relation Age of Onset  . Heart disease Father   . Prostate cancer Brother   . Kidney cancer Neg Hx   . Bladder Cancer Neg Hx     Social History:  reports  that he has never smoked. He has never used smokeless tobacco. He reports that he drinks about 2.0 standard drinks of alcohol per week. He reports that he does not use drugs.  ROS: UROLOGY Frequent Urination?: Yes Hard to postpone urination?: No Burning/pain with urination?: No Get up at night to urinate?: Yes Leakage of urine?: No Urine stream starts and stops?: No Trouble starting stream?: No Do you have to strain to urinate?: No Blood in urine?: No Urinary tract infection?: No Sexually transmitted disease?: No Injury to kidneys or bladder?: No Painful intercourse?: No Weak stream?: No Erection problems?: No Penile pain?: No  Gastrointestinal Nausea?: No Vomiting?: No Indigestion/heartburn?: No Diarrhea?: Yes Constipation?: No  Constitutional Fever: No Night sweats?: No Weight  loss?: No Fatigue?: No  Skin Skin rash/lesions?: No Itching?: No  Eyes Blurred vision?: No Double vision?: No  Ears/Nose/Throat Sore throat?: No Sinus problems?: No  Hematologic/Lymphatic Swollen glands?: No Easy bruising?: No  Cardiovascular Leg swelling?: No Chest pain?: No  Respiratory Cough?: No Shortness of breath?: No  Endocrine Excessive thirst?: No  Musculoskeletal Back pain?: No Joint pain?: No  Neurological Headaches?: No Dizziness?: No  Psychologic Depression?: No Anxiety?: No  Physical Exam: BP (!) 160/87   Pulse 72   Ht 6\' 2"  (1.88 m)   Wt 251 lb (113.9 kg)   BMI 32.23 kg/m   Constitutional:  Alert and oriented, No acute distress. HEENT: [place] AT, moist mucus membranes.  Trachea midline, no masses. Cardiovascular: No clubbing, cyanosis, or edema. Respiratory: Normal respiratory effort, no increased work of breathing. GI: Abdomen is soft, nontender, nondistended, no abdominal masses GU: No CVA tenderness Lymph: No cervical or inguinal lymphadenopathy. Skin: No rashes, bruises or suspicious lesions. Neurologic: Grossly intact, no focal deficits, moving all 4 extremities. Psychiatric: Normal mood and affect.  Laboratory Data: Lab Results  Component Value Date   WBC 10.4 08/28/2018   HGB 15.0 08/28/2018   HCT 43.7 08/28/2018   MCV 90.9 08/28/2018   PLT 209 08/28/2018    Lab Results  Component Value Date   CREATININE 0.89 08/28/2018    Pertinent Imaging: N/a  Assessment & Plan:    1. Prostate cancer [facility]) Personal history of low risk prostate cancer PSA is pending today, contacted with his PSA results and plan based on these results Encouraged him to have his PSA drawn prior to the visit in the future so that we will have something to discuss in clinic Follow-up in 6 months with PSA/DRE Consider prostate MRI if his PSA begins to rise - PSA; Future   Return in about 6 months (around 04/30/2019) for PSA (lab visit  prior).  Hollice Espy, MD  [facility] [address], [place], [place] [address] [phone]

## 2018-10-31 LAB — PSA: PROSTATE SPECIFIC AG, SERUM: 5.7 ng/mL — AB (ref 0.0–4.0)

## 2019-05-01 ENCOUNTER — Other Ambulatory Visit: Payer: Self-pay

## 2019-05-01 ENCOUNTER — Other Ambulatory Visit: Payer: BLUE CROSS/BLUE SHIELD

## 2019-05-01 DIAGNOSIS — C61 Malignant neoplasm of prostate: Secondary | ICD-10-CM

## 2019-05-02 ENCOUNTER — Other Ambulatory Visit: Payer: BLUE CROSS/BLUE SHIELD

## 2019-05-02 LAB — PSA: Prostate Specific Ag, Serum: 11.7 ng/mL — ABNORMAL HIGH (ref 0.0–4.0)

## 2019-05-06 ENCOUNTER — Telehealth (INDEPENDENT_AMBULATORY_CARE_PROVIDER_SITE_OTHER): Payer: BLUE CROSS/BLUE SHIELD | Admitting: Urology

## 2019-05-06 ENCOUNTER — Other Ambulatory Visit: Payer: Self-pay

## 2019-05-06 DIAGNOSIS — C61 Malignant neoplasm of prostate: Secondary | ICD-10-CM | POA: Diagnosis not present

## 2019-05-06 DIAGNOSIS — R972 Elevated prostate specific antigen [PSA]: Secondary | ICD-10-CM

## 2019-05-06 NOTE — Progress Notes (Signed)
Virtual Visit via Video Note  I connected with Kenneth Mayo on 05/06/19 at 11:15 AM EDT by a video enabled telemedicine application and verified that I am speaking with the correct person using two identifiers.  Location: Patient: home Provider: home   I discussed the limitations of evaluation and management by telemedicine and the availability of in person appointments. The patient expressed understanding and agreed to proceed.  History of Present Illness: 63 year old male with a personal history of Gleason 3 was 3 prostate cancer who returns today for routine 34-month follow-up via virtual visit with a PSA drawn prior.  PSA trend as below.  Most recently, his PSA has risen from 5.76 months ago now ~11.  He denies any changes in his urinary symptoms at baseline.  No increased urgency, frequency, dysuria, gross hematuria.  He denies any prostatic manipulation.  No history of prostatitis.  He does have a personal family history of prostate cancer.  PSA history:  02/2014 PSA 3.72 02/2015 PSA 5.66 03/24/15 PSA 3.2 04/01/2015 4K score 93% probability of NOT having aggressive prostate cancer on biopsy 08/2015 PSA 3.7 with normal DRE 02/2016 prostate 75 g on CT  08/2016 PSA 7 with a 25% free 09/2016 PSA 7,--> PNBXprostate 67 grams, T1c (subtle induration on left but this was benign) Gleason 3+3=6 left apex 1% Gleason 3+3=6 right mid 92% 02/2017 4.10 06/2017 4.68 09/2017 7.1--> PNBX Gleason 3+3 involving 4/12 cores bilaterally, low volume diease, TRUS vol 67.8 04/2018 6.00 10/2018 5.7 04/2019 11   Observations/Objective: Appears well, NAD.  Assessment and Plan:  1. Prostate cancer [facility]) Personal history of low risk prostate cancer with an acute rise up to 11 Given the fairly rapid rise and his numerous previous negative biopsies and PSA stability, I am more suspicious for inflammation then rapidly progressing cancer  We discussed various options at length today including pursuing  prostate MRI possibly followed by fusion biopsy, repeating PSA in 3 to 4 weeks to reassess with consideration of the previous based on repeat results Or going ahead and treating for presumed progression of prostate cancer.  Patient is most interested in having his PSA rechecked in 3 to 4 weeks which is reasonable to confirm that this is in fact a true rise.  2. Rising PSA level As above   Follow Up Instructions: Repeat PSA in 3-4 weeks, will call with results and plan   I discussed the assessment and treatment plan with the patient. The patient was provided an opportunity to ask questions and all were answered. The patient agreed with the plan and demonstrated an understanding of the instructions.   The patient was advised to call back or seek an in-person evaluation if the symptoms worsen or if the condition fails to improve as anticipated.  I provided 10 minutes of non-face-to-face time during this encounter.   Hollice Espy, MD

## 2019-05-27 ENCOUNTER — Other Ambulatory Visit: Payer: BLUE CROSS/BLUE SHIELD

## 2019-05-28 ENCOUNTER — Other Ambulatory Visit: Payer: BLUE CROSS/BLUE SHIELD

## 2019-05-28 ENCOUNTER — Other Ambulatory Visit: Payer: Self-pay

## 2019-05-28 DIAGNOSIS — C61 Malignant neoplasm of prostate: Secondary | ICD-10-CM

## 2019-05-29 ENCOUNTER — Telehealth: Payer: Self-pay

## 2019-05-29 DIAGNOSIS — R972 Elevated prostate specific antigen [PSA]: Secondary | ICD-10-CM

## 2019-05-29 DIAGNOSIS — C61 Malignant neoplasm of prostate: Secondary | ICD-10-CM

## 2019-05-29 LAB — PSA: Prostate Specific Ag, Serum: 19 ng/mL — ABNORMAL HIGH (ref 0.0–4.0)

## 2019-05-29 NOTE — Telephone Encounter (Signed)
-----   Message from Hollice Espy, MD sent at 05/29/2019  8:19 AM EDT ----- PSA is way up to 19.  I strongly recommend prostate MR with and without contrast.  If aggeable, order and arrange.  I will call with results.   Hollice Espy, MD

## 2019-05-29 NOTE — Telephone Encounter (Signed)
mychart sent.

## 2019-05-30 NOTE — Telephone Encounter (Signed)
Patient is agreeable, order placed Please schedule thanks

## 2019-06-09 ENCOUNTER — Ambulatory Visit
Admission: RE | Admit: 2019-06-09 | Discharge: 2019-06-09 | Disposition: A | Discharge status: Home / Self Care | Payer: BLUE CROSS/BLUE SHIELD | Source: Ambulatory Visit | Attending: Urology | Admitting: Urology

## 2019-06-09 ENCOUNTER — Other Ambulatory Visit: Payer: Self-pay

## 2019-06-09 DIAGNOSIS — C61 Malignant neoplasm of prostate: Secondary | ICD-10-CM | POA: Insufficient documentation

## 2019-06-09 DIAGNOSIS — R972 Elevated prostate specific antigen [PSA]: Secondary | ICD-10-CM | POA: Insufficient documentation

## 2019-06-09 LAB — POCT I-STAT CREATININE: Creatinine, Ser: 0.9 mg/dL (ref 0.61–1.24)

## 2019-06-09 MED ORDER — GADOBUTROL 1 MMOL/ML IV SOLN
10.0000 mL | Freq: Once | INTRAVENOUS | Status: AC | PRN
  Administered 2019-06-09: 10 mL via INTRAVENOUS

## 2019-06-10 ENCOUNTER — Telehealth: Payer: Self-pay

## 2019-06-10 NOTE — Telephone Encounter (Signed)
Left pt message to call °

## 2019-06-10 NOTE — Telephone Encounter (Signed)
-----   Message from Hollice Espy, MD sent at 06/10/2019 10:14 AM EDT ----- Please let this patient know that I reviewed his MRI.  It does appear that he has a higher grade lesion within his prostate on the left measuring just over a centimeter which is more concerning than the pathology on previous biopsies.  I have low strongly recommend a fusion biopsy which can be performed at [facility] urology in [place].  If he is agreeable to this, please arrange this and follow-up with me thereafter for results.  If he has any questions or concerns, we can have a virtual visit to discuss further.  Hollice Espy, MD

## 2019-06-17 NOTE — Telephone Encounter (Signed)
It doesn't look like she did sorry

## 2019-06-17 NOTE — Telephone Encounter (Signed)
Is Kenneth Mayo going to put the referral in or do I need to? Just don;t want to do it twice, I don't mind   Sharyn Lull

## 2019-06-17 NOTE — Telephone Encounter (Signed)
Patient notified, he would like to go ahead and [facility] Please schedule patient thanks

## 2019-07-17 ENCOUNTER — Telehealth: Payer: Self-pay | Admitting: Urology

## 2019-07-17 NOTE — Telephone Encounter (Signed)
Patient notified that that appointment time is OK. He states that was the next available time.

## 2019-07-17 NOTE — Telephone Encounter (Signed)
Pt called and states that he was supposed to have a biopsy at [facility] in [place], but his ins would not cover it. He has a consultation @ [facility] on 08/11/2019 and he would like to know if that is too far out. Please advise.

## 2019-07-29 ENCOUNTER — Telehealth: Payer: Self-pay | Admitting: Urology

## 2019-07-29 NOTE — Telephone Encounter (Signed)
Informed patient-verbalized understanding. Scheduled lab and follow up appointments.

## 2019-07-29 NOTE — Telephone Encounter (Signed)
Great news!  I just got message you were having insurance issues.    I think we can just keep observing you with serial PSA if its back down significantly.  No need to see [facility] in my opinion, they will likely recommend the same.  Please schedule visit in 4 months with PSA prior.  Hollice Espy, MD

## 2019-07-29 NOTE — Telephone Encounter (Signed)
Pt called and states that Dr Ginette Pitman @ [facility] drew his PSA and it has dropped from 19.0 to 5.60. Pt just wanted to let you know.

## 2019-09-09 ENCOUNTER — Ambulatory Visit: Payer: BLUE CROSS/BLUE SHIELD | Admitting: Urology

## 2019-11-20 ENCOUNTER — Other Ambulatory Visit: Payer: Self-pay | Admitting: Family Medicine

## 2019-11-20 DIAGNOSIS — C61 Malignant neoplasm of prostate: Secondary | ICD-10-CM

## 2019-11-20 DIAGNOSIS — R972 Elevated prostate specific antigen [PSA]: Secondary | ICD-10-CM

## 2019-11-24 ENCOUNTER — Other Ambulatory Visit: Payer: BLUE CROSS/BLUE SHIELD

## 2019-11-24 ENCOUNTER — Other Ambulatory Visit: Payer: Self-pay

## 2019-11-24 DIAGNOSIS — C61 Malignant neoplasm of prostate: Secondary | ICD-10-CM

## 2019-11-24 DIAGNOSIS — R972 Elevated prostate specific antigen [PSA]: Secondary | ICD-10-CM

## 2019-11-25 LAB — PSA: Prostate Specific Ag, Serum: 7.4 ng/mL — ABNORMAL HIGH (ref 0.0–4.0)

## 2019-11-26 ENCOUNTER — Ambulatory Visit: Payer: BLUE CROSS/BLUE SHIELD | Admitting: Urology

## 2019-11-26 ENCOUNTER — Other Ambulatory Visit: Payer: Self-pay

## 2019-11-26 VITALS — BP 123/73 | HR 80 | Ht 74.0 in | Wt 245.0 lb

## 2019-11-26 DIAGNOSIS — C61 Malignant neoplasm of prostate: Secondary | ICD-10-CM

## 2019-11-26 NOTE — Progress Notes (Signed)
11/26/2019 6:02 PM   Kenneth Mayo [birth year] [id number]  Referring provider: Tracie Harrier, MD [address] [facility] [place],  [place] [address]  Chief Complaint  Patient presents with  . Prostate Cancer    HPI: 63 yo M with personal history of prostate cancer returns today for routine surveillance.  Please see previous notes and below for details.  He has a known history of elevated PSA and has been followed for many years.  He was diagnosed with low risk prostate cancer in 2018 and has been followed closely on active surveillance.  Recently, his PSA is risen up to 19 but quickly trended back downwards to 7.4.  He underwent MR in 06/10/2019 which showed a 13 mm lesion, PI-RADS 4 suspicion for high-grade microscopic prostate cancer in the left mid peripheral zone.  There is no evidence of extracapsular extension or seminal vesicle invasion or metastatic disease.  At that point, I had recommended pursuing MRI fusion biopsy.  He had agreed to this but then found that his insurance did not cover fusion biopsy in [place].  As such, he was scheduled to see [facility] in August 2020.  In the interim, he called [place] noting that his PSA had been drawn at [facility] clinic in 07/2019 at which time he had trended all the way back down to 5.6 from 19 in June.  He never did see [facility].  He presents today to discuss options.  He is changing insurances at the new year.  PSA history:  02/2014 PSA 3.72 02/2015 PSA 5.66 03/24/15 PSA 3.2 04/01/2015 4K score 93% probability of NOT having aggressive prostate cancer on biopsy 08/2015 PSA 3.7 with normal DRE 02/2016 prostate 75 g on CT  08/2016 PSA 7 with a 25% free 09/2016 PSA 7,--> PNBXprostate 67 grams, T1c (subtle induration on left but this was benign) Gleason 3+3=6 left apex 1% Gleason 3+3=6 right mid 92% 02/2017 4.10 06/2017 4.68 09/2017 7.1--> PNBX Gleason 3+3 involving 4/12 cores bilaterally, low volume diease, TRUS vol  67.8 04/2018 6.00 10/2018 5.7 04/2019 11 05/2019 19 12/20 7.2   PMH: Past Medical History:  Diagnosis Date  . Arrhythmia   . CAD (coronary artery disease)   . Cancer [facility])    prostate cancer  . Elevated PSA   . Heart attack [facility])     Surgical History: Past Surgical History:  Procedure Laterality Date  . CORONARY ANGIOPLASTY WITH STENT PLACEMENT  2010  . KNEE SURGERY Right 1998  . NASAL SINUS SURGERY      Home Medications:  Allergies as of 11/26/2019      Reactions   Cefaclor Other (See Comments)   Aspirin Rash   Keflex [cephalexin] Rash   Other Rash   ASPRIN. ASPRIN   Penicillins Rash      Medication List       Accurate as of November 26, 2019 11:59 PM. If you have any questions, ask your nurse or doctor.        aspirin 81 MG tablet Take 81 mg by mouth daily.   clopidogrel 75 MG tablet Commonly known as: PLAVIX Take 75 mg by mouth daily.   Co-Enzyme Q-10 100 MG Caps Take 200 mg by mouth daily.   ferrous sulfate 325 (65 FE) MG tablet Take 325 mg by mouth daily.   FISH OIL PO Take by mouth.   GLUCOSAMINE-CHONDROITIN PO Take 1,500 mg by mouth daily. 1500   lisinopril 5 MG tablet Commonly known as: ZESTRIL Take 5  mg by mouth daily.   metoprolol succinate 50 MG 24 hr tablet Commonly known as: TOPROL-XL Take 50 mg by mouth daily.   montelukast 10 MG tablet Commonly known as: SINGULAIR Take 10 mg by mouth at bedtime.   nitroGLYCERIN 0.4 MG SL tablet Commonly known as: NITROSTAT Place 0.4 mg under the tongue every 5 (five) minutes as needed for chest pain.   pantoprazole 40 MG tablet Commonly known as: PROTONIX Take 40 mg by mouth daily.       Allergies:  Allergies  Allergen Reactions  . Aspirin Rash  . Cefaclor Rash  . Keflex [Cephalexin] Rash  . Other Rash    ASPRIN. ASPRIN 325  . Penicillins Rash    20 years    Family History: Family History  Problem Relation Age of Onset  . Heart disease Father   . Prostate cancer Brother    . Kidney cancer Neg Hx   . Bladder Cancer Neg Hx     Social History:  reports that he has never smoked. He has never used smokeless tobacco. He reports current alcohol use of about 2.0 standard drinks of alcohol per week. He reports that he does not use drugs.  ROS: UROLOGY Frequent Urination?: Yes Hard to postpone urination?: Yes Burning/pain with urination?: No Get up at night to urinate?: Yes Leakage of urine?: No Urine stream starts and stops?: No Trouble starting stream?: No Do you have to strain to urinate?: No Blood in urine?: No Urinary tract infection?: No Sexually transmitted disease?: No Injury to kidneys or bladder?: No Painful intercourse?: No Weak stream?: No Erection problems?: No Penile pain?: No  Gastrointestinal Nausea?: No Vomiting?: No Indigestion/heartburn?: No Diarrhea?: No Constipation?: No  Constitutional Fever: No Night sweats?: No Weight loss?: No Fatigue?: No  Skin Skin rash/lesions?: No Itching?: No  Eyes Blurred vision?: No Double vision?: No  Ears/Nose/Throat Sore throat?: No Sinus problems?: No  Hematologic/Lymphatic Swollen glands?: No Easy bruising?: No  Cardiovascular Leg swelling?: No Chest pain?: No  Respiratory Cough?: No Shortness of breath?: No  Endocrine Excessive thirst?: No  Musculoskeletal Back pain?: No Joint pain?: No  Neurological Headaches?: No Dizziness?: No  Psychologic Depression?: No Anxiety?: No  Physical Exam: BP 123/73   Pulse 80   Ht 6\' 2"  (1.88 m)   Wt 245 lb (111.1 kg)   BMI 31.46 kg/m   Constitutional:  Alert and oriented, No acute distress. HEENT: [place] AT, moist mucus membranes.  Trachea midline, no masses. Cardiovascular: No clubbing, cyanosis, or edema. Respiratory: Normal respiratory effort, no increased work of breathing. Skin: No rashes, bruises or suspicious lesions. Neurologic: Grossly intact, no focal deficits, moving all 4 extremities. Psychiatric: Normal mood  and affect.  Laboratory Data: Lab Results  Component Value Date   WBC 10.4 08/28/2018   HGB 15.0 08/28/2018   HCT 43.7 08/28/2018   MCV 90.9 08/28/2018   PLT 209 08/28/2018    Lab Results  Component Value Date   CREATININE 0.90 06/09/2019    Results for orders placed or performed in visit on 11/24/19  PSA  Result Value Ref Range   Prostate Specific Ag, Serum 7.4 (H) 0.0 - 4.0 ng/mL     Assessment & Plan:    1. Prostate cancer [facility]) Personal history of low risk prostate cancer previously on active surveillance with significantly fluctuating PSA and more recently, somewhat concerning prostate MRI as above  Originally, we had plan to do a fusion biopsy but due to some insurance issues and downward trending  PSA, we have deferred this for the time being.  I do think that it is probably a good idea to proceed with fusion biopsy in the setting of this concerning lesion despite his PSA trending back downwards.  In light of his insurance change, he will be eligible to have the prostate fusion biopsy done at [facility] urology as previously discussed and he like to schedule this for early spring after all adjustments have been made.  I will plan to see him back after this fusion biopsy.  All question about the biopsy were answered. - Ambulatory referral to Urology   Return for schedule fusion biopsy in [facility] in March 2021, then see me for results.  Hollice Espy, MD  [facility] [address], [place], [place] [address] [phone]

## 2019-12-18 ENCOUNTER — Telehealth: Payer: Self-pay | Admitting: Urology

## 2019-12-18 NOTE — Telephone Encounter (Signed)
Tammy from [facility] called today and said when she called to schedule this patient for his Fusion BX he told her he was on Plavix and ASA and she said they could not do it without a clearance from his cardiologist.   Thanks, Sharyn Lull

## 2019-12-22 NOTE — Telephone Encounter (Signed)
Faxed Cardiac clearance to his cardiologist Dr. Saralyn Pilar

## 2020-01-16 ENCOUNTER — Other Ambulatory Visit: Payer: Self-pay | Admitting: Urology

## 2020-01-20 ENCOUNTER — Other Ambulatory Visit: Payer: Self-pay

## 2020-01-20 ENCOUNTER — Encounter: Payer: Self-pay | Admitting: Urology

## 2020-01-20 ENCOUNTER — Ambulatory Visit (INDEPENDENT_AMBULATORY_CARE_PROVIDER_SITE_OTHER): Payer: Medicare Other | Admitting: Urology

## 2020-01-20 VITALS — BP 138/72 | HR 84 | Ht 74.0 in | Wt 245.0 lb

## 2020-01-20 DIAGNOSIS — C61 Malignant neoplasm of prostate: Secondary | ICD-10-CM

## 2020-01-20 NOTE — Progress Notes (Signed)
01/20/2020 4:33 PM   Kenneth Mayo 18-Jan-1956 [id number]  Referring provider: Tracie Harrier, MD [address] [facility] [place],  [place] [address]  Chief Complaint  Patient presents with  . Results    HPI: 64 year old male who presents today to discuss fusion biopsy results.  Please see previous notes for details.  He has a known history of elevated PSA and has been followed for many years.  He was diagnosed with low risk prostate cancer in 2018 and has been followed closely on active surveillance.  Recently, his PSA is risen up to 19 but quickly trended back downwards to 7.4.  He underwent MR in 06/10/2019 which showed a 13 mm lesion, PI-RADS 4 suspicion for high-grade microscopic prostate cancer in the left mid peripheral zone.  There is no evidence of extracapsular extension or seminal vesicle invasion or metastatic disease.  He underwent MRI fusion biopsy.  This revealed Gleason 3+4 involving 2 of 3 cores, 20% of which only 20% was Gleason 4 pattern in the area of interest.  He also had an additional 3 foci on the left with Gleason 3+4, 4+3.  He 2 foci of 3+3 on the right.  He denies any significant baseline erectile dysfunction.  He reports that he cannot keep his erections quite as long but he is able to penetrate adequately.  He does not use any medications at this time.  This is an important part of his life.  No previous abdominal surgeries.  PMH: Past Medical History:  Diagnosis Date  . Arrhythmia   . CAD (coronary artery disease)   . Cancer [facility])    prostate cancer  . Elevated PSA   . Heart attack [facility])     Surgical History: Past Surgical History:  Procedure Laterality Date  . CORONARY ANGIOPLASTY WITH STENT PLACEMENT  2010  . KNEE SURGERY Right 1998  . NASAL SINUS SURGERY      Home Medications:  Allergies as of 01/20/2020      Reactions   Cefaclor Other (See Comments)   Aspirin Rash   Keflex [cephalexin] Rash   Other Rash   ASPRIN.  ASPRIN   Penicillins Rash      Medication List       Accurate as of January 20, 2020 11:59 PM. If you have any questions, ask your nurse or doctor.        aspirin 81 MG tablet Take 81 mg by mouth daily.   clopidogrel 75 MG tablet Commonly known as: PLAVIX Take by mouth.   Co-Enzyme Q-10 30 MG Caps Take by mouth.   ferrous sulfate 325 (65 FE) MG tablet Take by mouth.   FISH OIL PO Take by mouth.   glucosamine-chondroitin 500-400 MG tablet Take 1 tablet by mouth 3 (three) times daily.   lisinopril 5 MG tablet Commonly known as: ZESTRIL Take by mouth.   metoprolol succinate 25 MG 24 hr tablet Commonly known as: TOPROL-XL Take by mouth.   montelukast 10 MG tablet Commonly known as: SINGULAIR Take by mouth.   nitroGLYCERIN 0.4 MG SL tablet Commonly known as: NITROSTAT Place under the tongue.   pantoprazole 40 MG tablet Commonly known as: PROTONIX Take by mouth.       Allergies:  Allergies  Allergen Reactions  . Cefaclor Other (See Comments)  . Aspirin Rash  . Keflex [Cephalexin] Rash  . Other Rash    ASPRIN. ASPRIN  . Penicillins Rash    Family History: Family History  Problem Relation Age  of Onset  . Heart disease Father   . Prostate cancer Brother   . Kidney cancer Neg Hx   . Bladder Cancer Neg Hx     Social History:  reports that he has never smoked. He has never used smokeless tobacco. He reports current alcohol use of about 2.0 standard drinks of alcohol per week. He reports that he does not use drugs.  ROS: UROLOGY Frequent Urination?: Yes Hard to postpone urination?: No Burning/pain with urination?: No Get up at night to urinate?: Yes Leakage of urine?: No Urine stream starts and stops?: No Trouble starting stream?: No Do you have to strain to urinate?: No Blood in urine?: No Urinary tract infection?: No Sexually transmitted disease?: No Injury to kidneys or bladder?: No Painful intercourse?: No Weak stream?: No Erection  problems?: No Penile pain?: No  Gastrointestinal Nausea?: No Vomiting?: No Indigestion/heartburn?: No Diarrhea?: No Constipation?: No  Constitutional Fever: No Night sweats?: No Weight loss?: No Fatigue?: No  Skin Skin rash/lesions?: No Itching?: No  Eyes Blurred vision?: No Double vision?: No  Ears/Nose/Throat Sore throat?: No Sinus problems?: No  Hematologic/Lymphatic Swollen glands?: No Easy bruising?: No  Cardiovascular Leg swelling?: No Chest pain?: No  Respiratory Cough?: No Shortness of breath?: No  Endocrine Excessive thirst?: No  Musculoskeletal Back pain?: No Joint pain?: No  Neurological Headaches?: No Dizziness?: No  Psychologic Depression?: No Anxiety?: No  Physical Exam: BP 138/72   Pulse 84   Ht 6\' 2"  (1.88 m)   Wt 245 lb (111.1 kg)   BMI 31.46 kg/m   Constitutional:  Alert and oriented, No acute distress. HEENT: [place] AT, moist mucus membranes.  Trachea midline, no masses. Cardiovascular: No clubbing, cyanosis, or edema. Respiratory: Normal respiratory effort, no increased work of breathing. Skin: No rashes, bruises or suspicious lesions. Neurologic: Grossly intact, no focal deficits, moving all 4 extremities. Psychiatric: Normal mood and affect.   Assessment & Plan:    1. Prostate cancer [facility]) 64 year old male with a personal history of prostate cancer now with progression to unfavorable intermediate risk  Most recent MR from 2020 without evidence of extracapsular extension, adenopathy or seminal vesicle invasion.  He is no longer a candidate for active surveillance.  I recommended either consideration of radical prostatectomy with bilateral pelvic lymph node dissection versus radiation with 6 months of ADT.  We discussed possible side effects ADT at length as well today.  The patient was counseled about the natural history of prostate cancer and the standard treatment options that are available for prostate cancer. It was  explained to him how his age and life expectancy, clinical stage, Gleason score, and PSA affect his prognosis, the decision to proceed with additional staging studies, as well as how that information influences recommended treatment strategies. We discussed the roles for active surveillance, radiation therapy, surgical therapy, androgen deprivation, as well as ablative therapy options for the treatment of prostate cancer as appropriate to his individual cancer situation. We discussed the risks and benefits of these options with regard to their impact on cancer control and also in terms of potential adverse events, complications, and impact on quality of life particularly related to urinary, bowel, and sexual function. The patient was encouraged to ask questions throughout the discussion today and all questions were answered to his stated satisfaction. In addition, the patient was provided with and/or directed to appropriate resources and literature for further education about prostate cancer treatment options.  We discussed surgical therapy for prostate cancer including the different available  surgical approaches.  Specifically, we discussed robotic prostatectomy *with pelvic lymph node dissection based on his restratification.  We discussed, in detail, the risks and expectations of surgery with regard to cancer control, urinary control, and erectile dysfunction as well as expected post operative recovery processed. Additional risks of surgery including but not limited to bleeding, infection, hernia formation, nerve damage, fistula formation, bowel/rectal injury, potentially necessitating colostomy, damage to the urinary tract resulting in urinary leakage, urethral stricture, and cardiopulmonary risk such as myocardial infarction, stroke, death, thromboembolism etc. were explained.   He believes he might be leaning towards radiation given his concern for erectile dysfunction.  We discussed the 5 and 10-year  quality of life data as relates to each.  Plan for radiation oncology consult.  We will follow back up with him after speaks with Dr. Baruch Gouty move forward with his decision.  - Ambulatory referral to [place], MD  [facility] [address] [place], [place] [address] [phone]  I spent 40 min with this patient of which greater than 50% was spent in counseling and coordination of care with the patient.

## 2020-01-28 ENCOUNTER — Other Ambulatory Visit: Payer: Self-pay

## 2020-01-29 ENCOUNTER — Other Ambulatory Visit: Payer: Self-pay

## 2020-01-29 ENCOUNTER — Ambulatory Visit
Admission: RE | Admit: 2020-01-29 | Discharge: 2020-01-29 | Disposition: A | Discharge status: Home / Self Care | Payer: Medicare Other | Source: Ambulatory Visit | Attending: Radiation Oncology | Admitting: Radiation Oncology

## 2020-01-29 ENCOUNTER — Encounter: Payer: Self-pay | Admitting: Radiation Oncology

## 2020-01-29 VITALS — BP 163/105 | HR 80 | Temp 98.5°F | Resp 16 | Wt 251.1 lb

## 2020-01-29 DIAGNOSIS — Z923 Personal history of irradiation: Secondary | ICD-10-CM | POA: Diagnosis not present

## 2020-01-29 DIAGNOSIS — Z79899 Other long term (current) drug therapy: Secondary | ICD-10-CM | POA: Insufficient documentation

## 2020-01-29 DIAGNOSIS — C61 Malignant neoplasm of prostate: Secondary | ICD-10-CM | POA: Insufficient documentation

## 2020-01-29 DIAGNOSIS — I499 Cardiac arrhythmia, unspecified: Secondary | ICD-10-CM | POA: Insufficient documentation

## 2020-01-29 DIAGNOSIS — R197 Diarrhea, unspecified: Secondary | ICD-10-CM | POA: Diagnosis not present

## 2020-01-29 DIAGNOSIS — Z7982 Long term (current) use of aspirin: Secondary | ICD-10-CM | POA: Insufficient documentation

## 2020-01-29 DIAGNOSIS — I2510 Atherosclerotic heart disease of native coronary artery without angina pectoris: Secondary | ICD-10-CM | POA: Diagnosis not present

## 2020-01-29 NOTE — Consult Note (Signed)
NEW PATIENT EVALUATION  Name: Kenneth Mayo  MRN: [id number]  Date:   01/29/2020     DOB: 1956/12/09   This 64 y.o. 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  Patient is stage T2 by MRI criteria  REFERRING PHYSICIAN: Tracie Harrier, MD  CHIEF COMPLAINT:  Chief Complaint  Patient presents with  . Prostate Cancer    DIAGNOSIS: The encounter diagnosis was Prostate cancer ([place]).   PREVIOUS INVESTIGATIONS:  Pathology report reviewed MRI of prostate reviewed Clinical notes reviewed   HPI: Patient is a 64 year old male whose brother we treated over 4 years ago with I-125 interstitial implant for prostate cancer who is doing well.  Patient has presented with elevated PSA and biopsy-proven prostate cancer almost 4 years ago.  He has been active active surveillance although his PSA had risen to 19 but down trended to 7.4 recently.  MRI of his prostate back in June showed a 1.3 cm T2 lesion in the medial left peripheral zone suspicious for high-grade macroscopic prostate cancer.  No evidence of extracapsular extension seminal vesicle invasion or metastatic disease was noted.  Patient underwent repeat biopsy showing 4 out of 8 biopsies positive mostly for Gleason 7 (3+4) there was bilateral gland involvement.  Patient is fairly asymptomatic.  He specifically denies any dysuria nocturia frequency urgency.  Patient does have decreased capacity to hold an erection.  Patient also does have occasional intermittent diarrhea.  Treatment options have been discussed including robotic prostatectomy by Dr. Erlene Mayo he is now referred to radiation oncology for opinion.  PLANNED TREATMENT REGIMEN: Patient to decide  PAST MEDICAL HISTORY:  has a past medical history of Arrhythmia, CAD (coronary artery disease), Cancer ([place]), Elevated PSA, and Heart attack ([place]).    PAST SURGICAL HISTORY:  Past  Surgical History:  Procedure Laterality Date  . CORONARY ANGIOPLASTY WITH STENT PLACEMENT  2010  . KNEE SURGERY Right 1998  . NASAL SINUS SURGERY      FAMILY HISTORY: family history includes Heart disease in his father; Prostate cancer in his brother.  SOCIAL HISTORY:  reports that he has never smoked. He has never used smokeless tobacco. He reports current alcohol use of about 2.0 standard drinks of alcohol per week. He reports that he does not use drugs.  ALLERGIES: Cefaclor, Aspirin, Keflex [cephalexin], Other, and Penicillins  MEDICATIONS:  Current Outpatient Medications  Medication Sig Dispense Refill  . aspirin 81 MG tablet Take 81 mg by mouth daily.    . clopidogrel (PLAVIX) 75 MG tablet Take by mouth.    Marland Kitchen Co-Enzyme Q-10 30 MG CAPS Take by mouth.    . ferrous sulfate 325 (65 FE) MG tablet Take by mouth.    Marland Kitchen glucosamine-chondroitin 500-400 MG tablet Take 1 tablet by mouth 3 (three) times daily.    Marland Kitchen lisinopril (PRINIVIL,ZESTRIL) 5 MG tablet Take by mouth.    . metoprolol succinate (TOPROL-XL) 25 MG 24 hr tablet Take by mouth.    . montelukast (SINGULAIR) 10 MG tablet Take by mouth.    . nitroGLYCERIN (NITROSTAT) 0.4 MG SL tablet Place under the tongue.    . Omega-3 Fatty Acids (FISH OIL PO) Take by mouth.    . pantoprazole (PROTONIX) 40 MG tablet Take by mouth.     Current Facility-Administered Medications  Medication Dose Route Frequency Provider Last Rate Last Admin  . gentamicin (GARAMYCIN) injection 80 mg  80  mg Intramuscular Once Kenneth Espy, MD        ECOG PERFORMANCE STATUS:  0 - Asymptomatic  REVIEW OF SYSTEMS: Patient denies any weight loss, fatigue, weakness, fever, chills or night sweats. Patient denies any loss of vision, blurred vision. Patient denies any ringing  of the ears or hearing loss. No irregular heartbeat. Patient denies heart murmur or history of fainting. Patient denies any chest pain or pain radiating to her upper extremities. Patient denies  any shortness of breath, difficulty breathing at night, cough or hemoptysis. Patient denies any swelling in the lower legs. Patient denies any nausea vomiting, vomiting of blood, or coffee ground material in the vomitus. Patient denies any stomach pain. Patient states has had normal bowel movements no significant constipation or diarrhea. Patient denies any dysuria, hematuria or significant nocturia. Patient denies any problems walking, swelling in the joints or loss of balance. Patient denies any skin changes, loss of hair or loss of weight. Patient denies any excessive worrying or anxiety or significant depression. Patient denies any problems with insomnia. Patient denies excessive thirst, polyuria, polydipsia. Patient denies any swollen glands, patient denies easy bruising or easy bleeding. Patient denies any recent infections, allergies or URI. Patient "s visual fields have not changed significantly in recent time.   PHYSICAL EXAM: BP (!) 163/105 (BP Location: Right Leg, Patient Position: Sitting, Cuff Size: Large)   Pulse 80   Temp 98.5 F (36.9 C) (Tympanic)   Resp 16   Wt 251 lb 1.6 oz (113.9 kg)   BMI 32.24 kg/m  Well-developed well-nourished patient in NAD. HEENT reveals PERLA, EOMI, discs not visualized.  Oral cavity is clear. No oral mucosal lesions are identified. Neck is clear without evidence of cervical or supraclavicular adenopathy. Lungs are clear to A&P. Cardiac examination is essentially unremarkable with regular rate and rhythm without murmur rub or thrill. Abdomen is benign with no organomegaly or masses noted. Motor sensory and DTR levels are equal and symmetric in the upper and lower extremities. Cranial nerves II through XII are grossly intact. Proprioception is intact. No peripheral adenopathy or edema is identified. No motor or sensory levels are noted. Crude visual fields are within normal range.  LABORATORY DATA: Pathology report reviewed    RADIOLOGY RESULTS: MRI of  prostate reviewed   IMPRESSION: Stage IIb adenocarcinoma the prostate Gleason 7 (3+4) presenting with a PSA of 7.4  PLAN: At this time of run the [facility] showing a risk of organ confined disease at 55% lymph node involvement 4% and seminal vesicle invasion of 4%.  I have gone over the risks and benefits of radiation therapy including external beam treatment as well as I-125 interstitial implant.  Based on his young age excellent condition would favor I-125 interstitial implant with curative intent.  I have also discussed the ability after surgery to do salvage radiation and that ability to salvage after radiation is not available.  Patient and wife are both allowed to ask questions.  He may be leaning towards I-125 interstitial implant although he will make a decision in the next week and inform us.  If he decides on implant would send him back to urology for 59-month ADT.  Patient wife both seem to comprehend my recommendations well.  I would like to take this opportunity to thank you for allowing me to participate in the care of your patient.Noreene Filbert, MD

## 2020-02-11 ENCOUNTER — Telehealth (INDEPENDENT_AMBULATORY_CARE_PROVIDER_SITE_OTHER): Payer: Medicare Other | Admitting: Urology

## 2020-02-11 ENCOUNTER — Other Ambulatory Visit: Payer: Self-pay

## 2020-02-11 DIAGNOSIS — C61 Malignant neoplasm of prostate: Secondary | ICD-10-CM

## 2020-02-11 NOTE — Progress Notes (Signed)
Virtual Visit via Video Note  I connected with Kenneth Mayo on 12/10/20 at  1:45 PM EST by a video enabled telemedicine application and verified that I am speaking with the correct person using two identifiers.   Location: Patient: Home Accompanied by wife  Provider: In office    I discussed the limitations of evaluation and management by telemedicine and the availability of in person appointments. The patient expressed understanding and agreed to proceed.  History of Present Illness: Kenneth Mayo is a 64 yo M who returns today for the evaluation and management of his newly diagnosed faverable intermediate prostate cancer after his visit with Dr. Baruch Gouty.    Please see previous notes for details.    Leaning towards this treatment modality over surgery at this point.  Numberous questions today about the procedure.    Observations/Objective: Pt is engaged and asking good questions  Assessment and Plan:  1. Intermediate Prostate Cancer  Discussed with patient regarding the risks and benefits of surgery vs. Brachytherapy radiation again today Pt elected Brachytherapy radiation w/ ADT for purpose of reduction of prostate volume and adjuvant therapy   Associated risk and benefits were discussed for ADT along with possible side effects A volume study, implant and cystoscopy discussed   Follow Up Instructions: Will reach out to Dr. Baruch Gouty to help arrange seed implant  ADT x 6 mo depo to be scheduled   I discussed the assessment and treatment plan with the patient. The patient was provided an opportunity to ask questions and all were answered. The patient agreed with the plan and demonstrated an understanding of the instructions.   The patient was advised to call back or seek an in-person evaluation if the symptoms worsen or if the condition fails to improve as anticipated.  I provided 25 minutes of non-face-to-face time during this encounter.  Dolores Frame, am acting as a scribe for Dr. Hollice Espy,   Hollice Espy, MD

## 2020-03-09 ENCOUNTER — Other Ambulatory Visit: Payer: Self-pay | Admitting: Radiology

## 2020-03-09 DIAGNOSIS — C61 Malignant neoplasm of prostate: Secondary | ICD-10-CM

## 2020-03-10 ENCOUNTER — Ambulatory Visit (INDEPENDENT_AMBULATORY_CARE_PROVIDER_SITE_OTHER): Payer: Medicare Other | Admitting: Physician Assistant

## 2020-03-10 ENCOUNTER — Other Ambulatory Visit: Payer: Self-pay

## 2020-03-10 DIAGNOSIS — C61 Malignant neoplasm of prostate: Secondary | ICD-10-CM | POA: Diagnosis not present

## 2020-03-10 MED ORDER — LEUPROLIDE ACETATE (6 MONTH) 45 MG ~~LOC~~ KIT
45.0000 mg | PACK | Freq: Once | SUBCUTANEOUS | Status: AC
  Administered 2020-03-10: 45 mg via SUBCUTANEOUS

## 2020-03-10 NOTE — Patient Instructions (Addendum)
Start daily calcium (1000-1200mg ) and Vitamin D (800-1000IU) supplementation to prevent bone loss.  Hormone Suppression Therapy for Prostate Cancer  Hormone suppression therapy is a treatment that can help to slow the growth of cancer cells in the prostate (prostate gland). It is also called androgen deprivation therapy (ADT). Hormone suppression therapy targets hormones in the body called androgens that may help cancer cells grow. This therapy reduces the amount of these hormones in the body or keeps the body from using them. Hormone suppression therapy alone will not cure prostate cancer, but it can slow the growth of prostate cancer and may shrink tumors over time. Your health care provider can help you find the most effective way to treat your type of prostate cancer and best fit your lifestyle. Types of hormone suppression therapy Orchiectomy Orchiectomy, also called surgical castration, is a surgery to remove one or both testicles. The testicles are where the two main androgens (testosterone and dihydrotestosterone) are made. Medicine therapy Medicine therapy, also called chemical castration, involves taking medicines to keep your body from making or using androgens. If you choose this therapy, you may take any of these medicines:  Luteinizing hormone-releasing hormone [facility]) agonist medicines. These medicines are injected or implanted under your skin to lower the amount of androgens that your testicles make. If you take these medicines, you may also be prescribed other medicines to help with side effects. Common LHRH agonist medicines include: ? Leuprolide. ? Goserelin. ? Histrelin. ? Triptorelin.  LHRH antagonist medicines. These medicines are like LHRH agonist medicines but they work faster. They are commonly used to prevent side effects when prostate cancer is in an advanced stage. A common LHRH antagonist medicine is degarelix.  CYP17 inhibitor medicines. These medicines help to stop  other glands from making androgens. They may be used if the prostate cancer is advanced and has not gotten better with surgery or other medicine treatment. A steroid medicine may be given with this type of medicine to help with side effects. A common CYP17 inhibitor medicine is abiraterone.  Anti-androgen medicines Anti-androgen medicines block areas on the body where androgens attach. This prevents the androgens from coming into contact with cancer cells and fueling their growth. These medicines include:  Flutamide.  Bicalutamide.  Enzalutamide.  Nilutamide. Androgen-suppressing medicines Androgen-suppressing medicines may be used if other hormone suppression treatments are not working. They are not commonly used, however, because of their side effects. These medicines include:  Estrogen.  Ketoconazole. What are the risks? Hormone suppression therapy may cause side effects, including:  Hot flashes.  A decrease or lack of sexual desire.  Erectile dysfunction (impotence).  A decrease in the size of the penis or testicles.  Breast tenderness.  An increase in breast size.  Fatigue.  Weight gain.  Thinning of the bones (osteoporosis).  Anemia.  Loss of muscle.  Depression.  Increased cholesterol levels.  Trouble with thinking or focusing.  Stomach upset and nausea.  Diarrhea. Hormone suppression therapy can also increase your risk of these problems:  High blood pressure (hypertension).  Stroke.  Diabetes.  Heart attack.  Heart disease. What are the benefits? One of the main benefits of hormone suppression therapy is having additional treatment options. You may have only one type of treatment or two or more types at the same time. Treatments may be combined to:  Help with side effects.  Treat advanced cancer.  Avoid treatments that may cause physical or emotional distress. Hormone suppression therapy may be used during any stage of  prostate cancer  treatment. Contact a health care provider if:  You have pain or side effects that do not get better with treatment.  You have trouble urinating.  You have new side effects that do not go away. Get help right away if:  You have severe chest pain.  You have trouble breathing.  You have an irregular heartbeat.  You have numbness or paralysis in the lower half of your body.  You are confused.  You have trouble talking or understanding. These symptoms may be an emergency. Do not wait to see if the symptoms will go away. Get medical help right away. Call your local emergency services (911 in the [place] Do not drive yourself to the hospital. Summary  Hormone suppression therapy is a treatment that can help to slow the growth of cancer cells in the prostate (prostate gland).  Hormone suppression therapy alone will not cure prostate cancer, but it can slow the growth of prostate cancer and may shrink tumors over time.  Treatment to suppress hormones may include surgery or medicines. This information is not intended to replace advice given to you by your health care provider. Make sure you discuss any questions you have with your health care provider. Document Revised: 12/15/2016 Document Reviewed: 11/08/2016 Elsevier Patient Education  [place]. Leuprolide injection What is this medicine? LEUPROLIDE (loo PROE lide) is a man-made hormone. It is used to treat the symptoms of prostate cancer. This medicine may also be used to treat children with early onset of puberty. It may be used for other hormonal conditions. This medicine may be used for other purposes; ask your health care provider or pharmacist if you have questions. COMMON BRAND NAME(S): Lupron What should I tell my health care provider before I take this medicine? They need to know if you have any of these conditions:  diabetes  heart disease or previous heart attack  high blood pressure  high cholesterol  pain  or difficulty passing urine  spinal cord metastasis  stroke  tobacco smoker  an unusual or allergic reaction to leuprolide, benzyl alcohol, other medicines, foods, dyes, or preservatives  pregnant or trying to get pregnant  breast-feeding How should I use this medicine? This medicine is for injection under the skin or into a muscle. You will be taught how to prepare and give this medicine. Use exactly as directed. Take your medicine at regular intervals. Do not take your medicine more often than directed. It is important that you put your used needles and syringes in a special sharps container. Do not put them in a trash can. If you do not have a sharps container, call your pharmacist or healthcare provider to get one. A special MedGuide will be given to you by the pharmacist with each prescription and refill. Be sure to read this information carefully each time. Talk to your pediatrician regarding the use of this medicine in children. While this medicine may be prescribed for children as young as 8 years for selected conditions, precautions do apply. Overdosage: If you think you have taken too much of this medicine contact a poison control center or emergency room at once. NOTE: This medicine is only for you. Do not share this medicine with others. What if I miss a dose? If you miss a dose, take it as soon as you can. If it is almost time for your next dose, take only that dose. Do not take double or extra doses. What may interact with this medicine? Do  not take this medicine with any of the following medications:  chasteberry This medicine may also interact with the following medications:  herbal or dietary supplements, like black cohosh or DHEA  male hormones, like estrogens or progestins and birth control pills, patches, rings, or injections  male hormones, like testosterone This list may not describe all possible interactions. Give your health care provider a list of all the  medicines, herbs, non-prescription drugs, or dietary supplements you use. Also tell them if you smoke, drink alcohol, or use illegal drugs. Some items may interact with your medicine. What should I watch for while using this medicine? Visit your doctor or health care professional for regular checks on your progress. During the first week, your symptoms may get worse, but then will improve as you continue your treatment. You may get hot flashes, increased bone pain, increased difficulty passing urine, or an aggravation of nerve symptoms. Discuss these effects with your doctor or health care professional, some of them may improve with continued use of this medicine. Male patients may experience a menstrual cycle or spotting during the first 2 months of therapy with this medicine. If this continues, contact your doctor or health care professional. This medicine may increase blood sugar. Ask your healthcare provider if changes in diet or medicines are needed if you have diabetes. What side effects may I notice from receiving this medicine? Side effects that you should report to your doctor or health care professional as soon as possible:  allergic reactions like skin rash, itching or hives, swelling of the face, lips, or tongue  breathing problems  chest pain  depression or memory disorders  pain in your legs or groin  pain at site where injected  severe headache  signs and symptoms of high blood sugar such as being more thirsty or hungry or having to urinate more than normal. You may also feel very tired or have blurry vision  swelling of the feet and legs  visual changes  vomiting Side effects that usually do not require medical attention (report to your doctor or health care professional if they continue or are bothersome):  breast swelling or tenderness  decrease in sex drive or performance  diarrhea  hot flashes  loss of appetite  muscle, joint, or bone  pains  nausea  redness or irritation at site where injected  skin problems or acne This list may not describe all possible side effects. Call your doctor for medical advice about side effects. You may report side effects to FDA at 1-800-FDA-1088. Where should I keep my medicine? Keep out of the reach of children. Store below 25 degrees C (77 degrees F). Do not freeze. Protect from light. Do not use if it is not clear or if there are particles present. Throw away any unused medicine after the expiration date. NOTE: This sheet is a summary. It may not cover all possible information. If you have questions about this medicine, talk to your doctor, pharmacist, or health care provider.  2020 Elsevier/Gold Standard (2018-10-03 09:52:48)

## 2020-03-10 NOTE — Progress Notes (Signed)
Eligard SubQ Injection   Due to Prostate Cancer patient is present today for a Eligard Injection.  Medication: Eligard 6 month Dose: 45 mg  Location: right  Lot: [id number] Exp: 10/2021  Patient tolerated well, no complications were noted  Performed by: Fonnie Jarvis, CMA  Per Dr. Erlene Quan patient is a one time dose of 77mo. Vitamin D 800-1000iu and Calium 1000-1200mg  daily while on Androgen Deprivation Therapy.

## 2020-03-10 NOTE — Progress Notes (Signed)
03/10/2020 3:47 PM   Kenneth Mayo 08-12-1956 [id number]  CC: Prostate cancer  HPI: Kenneth Mayo is a 64 y.o. male who presents today for initiation of ADT for management of favorable intermediate risk prostate cancer.  Upon consultation with Dr. Erlene Quan, he has elected proceed with brachytherapy and ADT for management of his prostate cancer.  He is scheduled to undergo radioactive seed implant on 06/07/2020.  Today, he reports feeling well.  He has no acute concerns.  He consents to proceeding with ADT at this time.  PSA 12.27 on 01/21/2020.  He does have a history of CAD and NSTEMI s/p RCA stent and sees Dr. Saralyn Pilar regularly for management of these.  He is currently taking a vitamin D supplement.  PMH: Past Medical History:  Diagnosis Date  . Arrhythmia   . CAD (coronary artery disease)   . Cancer [facility])    prostate cancer  . Elevated PSA   . Heart attack [facility])     Surgical History: Past Surgical History:  Procedure Laterality Date  . CORONARY ANGIOPLASTY WITH STENT PLACEMENT  2010  . KNEE SURGERY Right 1998  . NASAL SINUS SURGERY      Home Medications:  Allergies as of 03/10/2020      Reactions   Cefaclor Other (See Comments)   Aspirin Rash   Keflex [cephalexin] Rash   Other Rash   ASPRIN. ASPRIN   Penicillins Rash      Medication List       Accurate as of March 10, 2020  3:47 PM. If you have any questions, ask your nurse or doctor.        aspirin 81 MG tablet Take 81 mg by mouth daily.   clopidogrel 75 MG tablet Commonly known as: PLAVIX Take by mouth.   Co-Enzyme Q-10 30 MG Caps Take by mouth.   ferrous sulfate 325 (65 FE) MG tablet Take by mouth.   FISH OIL PO Take by mouth.   glucosamine-chondroitin 500-400 MG tablet Take 1 tablet by mouth 3 (three) times daily.   lisinopril 5 MG tablet Commonly known as: ZESTRIL Take by mouth.   metoprolol succinate 25 MG 24 hr tablet Commonly known as: TOPROL-XL Take by mouth.   montelukast 10  MG tablet Commonly known as: SINGULAIR Take by mouth.   nitroGLYCERIN 0.4 MG SL tablet Commonly known as: NITROSTAT Place under the tongue.   pantoprazole 40 MG tablet Commonly known as: PROTONIX Take by mouth.       Allergies:  Allergies  Allergen Reactions  . Cefaclor Other (See Comments)  . Aspirin Rash  . Keflex [Cephalexin] Rash  . Other Rash    ASPRIN. ASPRIN  . Penicillins Rash    Family History: Family History  Problem Relation Age of Onset  . Heart disease Father   . Prostate cancer Brother   . Kidney cancer Neg Hx   . Bladder Cancer Neg Hx     Social History:   reports that he has never smoked. He has never used smokeless tobacco. He reports current alcohol use of about 2.0 standard drinks of alcohol per week. He reports that he does not use drugs.  Physical Exam: There were no vitals taken for this visit.  Constitutional:  Alert and oriented, no acute distress, nontoxic appearing HEENT: [place], AT Cardiovascular: No clubbing, cyanosis, or edema Respiratory: Normal respiratory effort, no increased work of breathing Skin: No rashes, bruises or suspicious lesions Neurologic: Grossly intact, no focal deficits, moving all 4 extremities  Psychiatric: Normal mood and affect  Assessment & Plan:   1. Prostate cancer [facility]) Reviewed the risks of ADT today.  I explained that the purpose of treatment is to decrease his serum testosterone levels for management of his prostate cancer.  I explained that side effects include hot flashes, fatigue, ED, weight gain, and bone loss.  I consulted him to start daily calcium and vitamin D supplementation to reduce or prevent bone loss.  I encouraged him to stay active and include weightbearing exercise as possible to protect his bone health.  Additionally, I encouraged him to maintain routine follow-up with his cardiologist.  He expressed understanding.  Eligard administered today in clinic, see separate procedure note for details. -  leuprolide (6 Month) (ELIGARD) injection 45 mg   Return in 6 months (on 09/10/2020) for ADT follow-up with PSA.  Debroah Loop, PA-C  [facility] [address], [address] [place], [place] [address] [phone]

## 2020-03-24 ENCOUNTER — Other Ambulatory Visit: Payer: Medicare Other | Admitting: Urology

## 2020-05-10 ENCOUNTER — Other Ambulatory Visit
Admission: RE | Admit: 2020-05-10 | Discharge: 2020-05-10 | Disposition: A | Discharge status: Home / Self Care | Payer: Medicare Other | Source: Ambulatory Visit | Attending: Radiation Oncology | Admitting: Radiation Oncology

## 2020-05-10 ENCOUNTER — Other Ambulatory Visit: Payer: Self-pay

## 2020-05-10 DIAGNOSIS — Z01812 Encounter for preprocedural laboratory examination: Secondary | ICD-10-CM | POA: Diagnosis present

## 2020-05-10 DIAGNOSIS — Z20822 Contact with and (suspected) exposure to covid-19: Secondary | ICD-10-CM | POA: Diagnosis not present

## 2020-05-10 LAB — SARS CORONAVIRUS 2 (TAT 6-24 HRS): SARS Coronavirus 2: NEGATIVE

## 2020-05-12 ENCOUNTER — Encounter: Payer: Self-pay | Admitting: Radiation Oncology

## 2020-05-12 ENCOUNTER — Ambulatory Visit
Admission: RE | Admit: 2020-05-12 | Discharge: 2020-05-12 | Disposition: A | Discharge status: Home / Self Care | Payer: Medicare Other | Source: Ambulatory Visit | Attending: Radiation Oncology | Admitting: Radiation Oncology

## 2020-05-12 ENCOUNTER — Ambulatory Visit: Admission: RE | Admit: 2020-05-12 | Payer: Medicare Other | Source: Home / Self Care

## 2020-05-12 ENCOUNTER — Other Ambulatory Visit: Payer: Self-pay

## 2020-05-12 VITALS — BP 150/96 | HR 73 | Temp 97.0°F | Resp 16 | Wt 249.0 lb

## 2020-05-12 DIAGNOSIS — Z79899 Other long term (current) drug therapy: Secondary | ICD-10-CM | POA: Diagnosis not present

## 2020-05-12 DIAGNOSIS — I499 Cardiac arrhythmia, unspecified: Secondary | ICD-10-CM | POA: Diagnosis not present

## 2020-05-12 DIAGNOSIS — C61 Malignant neoplasm of prostate: Secondary | ICD-10-CM | POA: Insufficient documentation

## 2020-05-12 DIAGNOSIS — Z7982 Long term (current) use of aspirin: Secondary | ICD-10-CM | POA: Insufficient documentation

## 2020-05-12 DIAGNOSIS — I2510 Atherosclerotic heart disease of native coronary artery without angina pectoris: Secondary | ICD-10-CM | POA: Insufficient documentation

## 2020-05-12 DIAGNOSIS — I252 Old myocardial infarction: Secondary | ICD-10-CM | POA: Insufficient documentation

## 2020-05-12 DIAGNOSIS — Z510 Encounter for antineoplastic radiation therapy: Secondary | ICD-10-CM | POA: Diagnosis not present

## 2020-05-12 DIAGNOSIS — Z8042 Family history of malignant neoplasm of prostate: Secondary | ICD-10-CM | POA: Diagnosis not present

## 2020-05-12 SURGERY — ULTRASOUND, PROSTATE, FOR VOLUME DETERMINATION
Anesthesia: Choice

## 2020-05-12 NOTE — H&P (Signed)
NEW PATIENT EVALUATION  Name: Kenneth Mayo  MRN: [id number]  Date:   05/12/2020     DOB: 04/06/1956   This 64 y.o. male patient presents to the clinic for history and physical in preparation I-125 interstitial implant for stage IIb adenocarcinoma the prostate  REFERRING PHYSICIAN: Tracie Harrier, MD  CHIEF COMPLAINT:  Chief Complaint  Patient presents with  . Prostate Cancer    DIAGNOSIS: The encounter diagnosis was Prostate cancer ([place]).   PREVIOUS INVESTIGATIONS:  Clinical notes reviewed Volume study performed  HPI: Patient is a 64 year old male initially consulted back in February who presented with an elevated PSA for years prior.  He had been on active surveillance although PSA continues to climb up to 7.4.  He had MRI scan of his prostate back in June 2020 showing a T2 lesion the medial left portion of his gland suspicious for high-grade prostate cancer.  He underwent repeat biopsy showing 4 of 8 cores positive for mostly Gleason 7 (3+4) adenocarcinoma with bilateral gland involvement.  He opted for I-125 interstitial implant and is seen today for volume study.  He is continues to do well very little lower urinary tract symptoms.  PLANNED TREATMENT REGIMEN: I-125 interstitial implant  PAST MEDICAL HISTORY:  has a past medical history of Arrhythmia, CAD (coronary artery disease), Cancer ([place]), Elevated PSA, and Heart attack ([place]).    PAST SURGICAL HISTORY:  Past Surgical History:  Procedure Laterality Date  . CORONARY ANGIOPLASTY WITH STENT PLACEMENT  2010  . KNEE SURGERY Right 1998  . NASAL SINUS SURGERY      FAMILY HISTORY: family history includes Heart disease in his father; Prostate cancer in his brother.  SOCIAL HISTORY:  reports that he has never smoked. He has never used smokeless tobacco. He reports current alcohol use of about 2.0 standard drinks of alcohol per week. He reports that he does not use drugs.  ALLERGIES: Aspirin, Cefaclor, Keflex [cephalexin],  Other, and Penicillins  MEDICATIONS:  Current Outpatient Medications  Medication Sig Dispense Refill  . acetaminophen (TYLENOL) 500 MG tablet Take 1,000 mg by mouth every 6 (six) hours as needed for mild pain or moderate pain.    Marland Kitchen aspirin 81 MG tablet Take 81 mg by mouth daily.    . clopidogrel (PLAVIX) 75 MG tablet Take 75 mg by mouth daily.     Marland Kitchen Co-Enzyme Q-10 100 MG CAPS Take 200 mg by mouth daily.     . ferrous sulfate 325 (65 FE) MG tablet Take 325 mg by mouth daily.     Marland Kitchen GLUCOSAMINE-CHONDROITIN PO Take 1,500 mg by mouth daily. 1500    . GUAIFENESIN ER PO Take 800 mg by mouth 2 (two) times daily as needed for to loosen phlegm.    Marland Kitchen losartan (COZAAR) 25 MG tablet Take 25 mg by mouth daily.     . metoprolol succinate (TOPROL-XL) 50 MG 24 hr tablet Take 50 mg by mouth daily.     . montelukast (SINGULAIR) 10 MG tablet Take 10 mg by mouth at bedtime.     . nitroGLYCERIN (NITROSTAT) 0.4 MG SL tablet Place 0.4 mg under the tongue every 5 (five) minutes as needed for chest pain.     . pantoprazole (PROTONIX) 40 MG tablet Take 40 mg by mouth daily.     . phenylephrine (SUDAFED PE) 10 MG TABS tablet Take 10 mg by mouth every 4 (four) hours as needed (facial pressure).    . rosuvastatin (CRESTOR) 5 MG tablet Take 5 mg by  mouth daily.    Marland Kitchen zinc gluconate 50 MG tablet Take 50 mg by mouth daily.     Current Facility-Administered Medications  Medication Dose Route Frequency Provider Last Rate Last Admin  . gentamicin (GARAMYCIN) injection 80 mg  80 mg Intramuscular Once Hollice Espy, MD        ECOG PERFORMANCE STATUS:  0 - Asymptomatic  REVIEW OF SYSTEMS: Patient denies any weight loss, fatigue, weakness, fever, chills or night sweats. Patient denies any loss of vision, blurred vision. Patient denies any ringing  of the ears or hearing loss. No irregular heartbeat. Patient denies heart murmur or history of fainting. Patient denies any chest pain or pain radiating to her upper extremities.  Patient denies any shortness of breath, difficulty breathing at night, cough or hemoptysis. Patient denies any swelling in the lower legs. Patient denies any nausea vomiting, vomiting of blood, or coffee ground material in the vomitus. Patient denies any stomach pain. Patient states has had normal bowel movements no significant constipation or diarrhea. Patient denies any dysuria, hematuria or significant nocturia. Patient denies any problems walking, swelling in the joints or loss of balance. Patient denies any skin changes, loss of hair or loss of weight. Patient denies any excessive worrying or anxiety or significant depression. Patient denies any problems with insomnia. Patient denies excessive thirst, polyuria, polydipsia. Patient denies any swollen glands, patient denies easy bruising or easy bleeding. Patient denies any recent infections, allergies or URI. Patient "s visual fields have not changed significantly in recent time.   PHYSICAL EXAM: BP (!) 150/96 (BP Location: Left Arm, Patient Position: Sitting, Cuff Size: Normal)   Pulse 73   Temp (!) 97 F (36.1 C) (Tympanic)   Resp 16   Wt 249 lb (112.9 kg)   BMI 31.97 kg/m  Well-developed well-nourished patient in NAD. HEENT reveals PERLA, EOMI, discs not visualized.  Oral cavity is clear. No oral mucosal lesions are identified. Neck is clear without evidence of cervical or supraclavicular adenopathy. Lungs are clear to A&P. Cardiac examination is essentially unremarkable with regular rate and rhythm without murmur rub or thrill. Abdomen is benign with no organomegaly or masses noted. Motor sensory and DTR levels are equal and symmetric in the upper and lower extremities. Cranial nerves II through XII are grossly intact. Proprioception is intact. No peripheral adenopathy or edema is identified. No motor or sensory levels are noted. Crude visual fields are within normal range.  LABORATORY DATA: Pathology and labs reviewed    RADIOLOGY RESULTS:  MRI scan reviewed volume study performed   IMPRESSION: Stage IIb Gleason 7 (3+4) adenocarcinoma prostate for I-125 interstitial implant in 64 year old male  PLAN: At this time patient is cleared to proceed with I-125 interstitial implant.  Volume study was performed today for treatment planning purposes.  Risks and benefits of treatment including radiation safety precautions as well as increased lower urinary tract symptoms diarrhea fatigue alteration blood counts secondary to implant all were discussed in detail with the patient.  He comprehends her treatment plan well.  I would like to take this opportunity to thank you for allowing me to participate in the care of your patient.Noreene Filbert, MD

## 2020-05-12 NOTE — H&P (View-Only) (Signed)
NEW PATIENT EVALUATION  Name: Kenneth Mayo  MRN: [id number]  Date:   05/12/2020     DOB: 08-31-1956   This 64 y.o. male patient presents to the clinic for history and physical in preparation I-125 interstitial implant for stage IIb adenocarcinoma the prostate  REFERRING PHYSICIAN: Tracie Harrier, MD  CHIEF COMPLAINT:  Chief Complaint  Patient presents with  . Prostate Cancer    DIAGNOSIS: The encounter diagnosis was Prostate cancer ([place]).   PREVIOUS INVESTIGATIONS:  Clinical notes reviewed Volume study performed  HPI: Patient is a 64 year old male initially consulted back in February who presented with an elevated PSA for years prior.  He had been on active surveillance although PSA continues to climb up to 7.4.  He had MRI scan of his prostate back in June 2020 showing a T2 lesion the medial left portion of his gland suspicious for high-grade prostate cancer.  He underwent repeat biopsy showing 4 of 8 cores positive for mostly Gleason 7 (3+4) adenocarcinoma with bilateral gland involvement.  He opted for I-125 interstitial implant and is seen today for volume study.  He is continues to do well very little lower urinary tract symptoms.  PLANNED TREATMENT REGIMEN: I-125 interstitial implant  PAST MEDICAL HISTORY:  has a past medical history of Arrhythmia, CAD (coronary artery disease), Cancer ([place]), Elevated PSA, and Heart attack ([place]).    PAST SURGICAL HISTORY:  Past Surgical History:  Procedure Laterality Date  . CORONARY ANGIOPLASTY WITH STENT PLACEMENT  2010  . KNEE SURGERY Right 1998  . NASAL SINUS SURGERY      FAMILY HISTORY: family history includes Heart disease in his father; Prostate cancer in his brother.  SOCIAL HISTORY:  reports that he has never smoked. He has never used smokeless tobacco. He reports current alcohol use of about 2.0 standard drinks of alcohol per week. He reports that he does not use drugs.  ALLERGIES: Aspirin, Cefaclor, Keflex [cephalexin],  Other, and Penicillins  MEDICATIONS:  Current Outpatient Medications  Medication Sig Dispense Refill  . acetaminophen (TYLENOL) 500 MG tablet Take 1,000 mg by mouth every 6 (six) hours as needed for mild pain or moderate pain.    Marland Kitchen aspirin 81 MG tablet Take 81 mg by mouth daily.    . clopidogrel (PLAVIX) 75 MG tablet Take 75 mg by mouth daily.     Marland Kitchen Co-Enzyme Q-10 100 MG CAPS Take 200 mg by mouth daily.     . ferrous sulfate 325 (65 FE) MG tablet Take 325 mg by mouth daily.     Marland Kitchen GLUCOSAMINE-CHONDROITIN PO Take 1,500 mg by mouth daily. 1500    . GUAIFENESIN ER PO Take 800 mg by mouth 2 (two) times daily as needed for to loosen phlegm.    Marland Kitchen losartan (COZAAR) 25 MG tablet Take 25 mg by mouth daily.     . metoprolol succinate (TOPROL-XL) 50 MG 24 hr tablet Take 50 mg by mouth daily.     . montelukast (SINGULAIR) 10 MG tablet Take 10 mg by mouth at bedtime.     . nitroGLYCERIN (NITROSTAT) 0.4 MG SL tablet Place 0.4 mg under the tongue every 5 (five) minutes as needed for chest pain.     . pantoprazole (PROTONIX) 40 MG tablet Take 40 mg by mouth daily.     . phenylephrine (SUDAFED PE) 10 MG TABS tablet Take 10 mg by mouth every 4 (four) hours as needed (facial pressure).    . rosuvastatin (CRESTOR) 5 MG tablet Take 5 mg by  mouth daily.    Marland Kitchen zinc gluconate 50 MG tablet Take 50 mg by mouth daily.     Current Facility-Administered Medications  Medication Dose Route Frequency Provider Last Rate Last Admin  . gentamicin (GARAMYCIN) injection 80 mg  80 mg Intramuscular Once Hollice Espy, MD        ECOG PERFORMANCE STATUS:  0 - Asymptomatic  REVIEW OF SYSTEMS: Patient denies any weight loss, fatigue, weakness, fever, chills or night sweats. Patient denies any loss of vision, blurred vision. Patient denies any ringing  of the ears or hearing loss. No irregular heartbeat. Patient denies heart murmur or history of fainting. Patient denies any chest pain or pain radiating to her upper extremities.  Patient denies any shortness of breath, difficulty breathing at night, cough or hemoptysis. Patient denies any swelling in the lower legs. Patient denies any nausea vomiting, vomiting of blood, or coffee ground material in the vomitus. Patient denies any stomach pain. Patient states has had normal bowel movements no significant constipation or diarrhea. Patient denies any dysuria, hematuria or significant nocturia. Patient denies any problems walking, swelling in the joints or loss of balance. Patient denies any skin changes, loss of hair or loss of weight. Patient denies any excessive worrying or anxiety or significant depression. Patient denies any problems with insomnia. Patient denies excessive thirst, polyuria, polydipsia. Patient denies any swollen glands, patient denies easy bruising or easy bleeding. Patient denies any recent infections, allergies or URI. Patient "s visual fields have not changed significantly in recent time.   PHYSICAL EXAM: BP (!) 150/96 (BP Location: Left Arm, Patient Position: Sitting, Cuff Size: Normal)   Pulse 73   Temp (!) 97 F (36.1 C) (Tympanic)   Resp 16   Wt 249 lb (112.9 kg)   BMI 31.97 kg/m  Well-developed well-nourished patient in NAD. HEENT reveals PERLA, EOMI, discs not visualized.  Oral cavity is clear. No oral mucosal lesions are identified. Neck is clear without evidence of cervical or supraclavicular adenopathy. Lungs are clear to A&P. Cardiac examination is essentially unremarkable with regular rate and rhythm without murmur rub or thrill. Abdomen is benign with no organomegaly or masses noted. Motor sensory and DTR levels are equal and symmetric in the upper and lower extremities. Cranial nerves II through XII are grossly intact. Proprioception is intact. No peripheral adenopathy or edema is identified. No motor or sensory levels are noted. Crude visual fields are within normal range.  LABORATORY DATA: Pathology and labs reviewed    RADIOLOGY RESULTS:  MRI scan reviewed volume study performed   IMPRESSION: Stage IIb Gleason 7 (3+4) adenocarcinoma prostate for I-125 interstitial implant in 64 year old male  PLAN: At this time patient is cleared to proceed with I-125 interstitial implant.  Volume study was performed today for treatment planning purposes.  Risks and benefits of treatment including radiation safety precautions as well as increased lower urinary tract symptoms diarrhea fatigue alteration blood counts secondary to implant all were discussed in detail with the patient.  He comprehends her treatment plan well.  I would like to take this opportunity to thank you for allowing me to participate in the care of your patient.Noreene Filbert, MD

## 2020-05-12 NOTE — Progress Notes (Signed)
Radiation Oncology Volume study note  Name: Kenneth Mayo   Date:   05/12/2020 MRN:  [id number] DOB: [birth year]    This 64 y.o. male presents to the hospital today for volume study in anticipation of an I-125 interstitial implant for a stage IIb adenocarcinoma  REFERRING PROVIDER: Tracie Harrier, MD  HPI:  Patient is a 64 year old male initially consulted back in February who presented with an elevated PSA for years prior.  He had been on active surveillance although PSA continues to climb up to 7.4.  He had MRI scan of his prostate back in June 2020 showing a T2 lesion the medial left portion of his gland suspicious for high-grade prostate cancer.  He underwent repeat biopsy showing 4 of 8 cores positive for mostly Gleason 7 (3+4) adenocarcinoma with bilateral gland involvement.  He opted for I-125 interstitial implant and is seen today for volume study.  He is continues to do well very little lower urinary tract symptoms..  COMPLICATIONS OF TREATMENT: none  FOLLOW UP COMPLIANCE: keeps appointments   PHYSICAL EXAM:  There were no vitals taken for this visit. Well-developed well-nourished patient in NAD. HEENT reveals PERLA, EOMI, discs not visualized.  Oral cavity is clear. No oral mucosal lesions are identified. Neck is clear without evidence of cervical or supraclavicular adenopathy. Lungs are clear to A&P. Cardiac examination is essentially unremarkable with regular rate and rhythm without murmur rub or thrill. Abdomen is benign with no organomegaly or masses noted. Motor sensory and DTR levels are equal and symmetric in the upper and lower extremities. Cranial nerves II through XII are grossly intact. Proprioception is intact. No peripheral adenopathy or edema is identified. No motor or sensory levels are noted. Crude visual fields are within normal range.  RADIOLOGY RESULTS: Ultrasound used for volume study  PLAN: Patient was taken to the cystoscopy suite in the OR. Patient was placed in  the low lithotomy position. Foley catheter was placed. Trans-rectal ultrasound probe was inserted into the rectum and prostate seminal vesicles were visualized as well as bladder base. stepping images were performed on a 5 mm increments. Images will be placed in [facility] treatment planning system to determine seed placement coordinates for eventual I-125 interstitial implant. Images will be reviewed with the physics and dosimetry staff for final quality approval. I personally was present for the volume study and assisted in delineation of contour volumes.  At the end of the procedure Foley catheter was removed, rectal ultrasound probe was removed. Patient tolerated his procedures extremely well with no side effects or complaints. Patient has given appointment for interstitial implant date. Consent was signed today as well as history and physical performed in preparation for his outpatient surgical implant.     Noreene Filbert, MD

## 2020-05-13 DIAGNOSIS — Z510 Encounter for antineoplastic radiation therapy: Secondary | ICD-10-CM | POA: Diagnosis not present

## 2020-05-28 ENCOUNTER — Other Ambulatory Visit: Payer: Self-pay

## 2020-05-28 ENCOUNTER — Other Ambulatory Visit
Admission: RE | Admit: 2020-05-28 | Discharge: 2020-05-28 | Disposition: A | Discharge status: Home / Self Care | Payer: Medicare Other | Source: Ambulatory Visit | Attending: Urology | Admitting: Urology

## 2020-05-28 HISTORY — DX: Anemia, unspecified: D64.9

## 2020-05-28 HISTORY — DX: Gastro-esophageal reflux disease without esophagitis: K21.9

## 2020-05-28 HISTORY — DX: Unspecified asthma, uncomplicated: J45.909

## 2020-05-28 NOTE — Patient Instructions (Signed)
COVID TESTING Date: Thursday June 03, 2020 Testing site:  [facility] Thru Hours:  5:18 am - 1:00 pm Once you are tested, you are asked to stay quarantined (avoiding public places) until after your surgery.   Your procedure is scheduled on: June 07, 2020 Monday Report to Day Surgery on the 2nd floor of the [facility]. To find out your arrival time, please call [phone] between 1PM - 3PM on: June 04, 2020 Friday   REMEMBER: Instructions that are not followed completely may result in serious medical risk, up to and including death; or upon the discretion of your surgeon and anesthesiologist your surgery may need to be rescheduled.  Do not eat food after midnight the night before surgery.  No gum chewing, lozengers or hard candies.  You may however, drink CLEAR liquids up to 2 hours before you are scheduled to arrive for your surgery. Do not drink anything within 2 hours of your scheduled arrival time.  Clear liquids include: - water  - apple juice without pulp - gatorade (not RED) - black coffee or tea (Do NOT add milk or creamers to the coffee or tea) Do NOT drink anything that is not on this list.  Type 1 and Type 2 diabetics should only drink water.   TAKE THESE MEDICATIONS THE MORNING OF SURGERY WITH A SIP OF WATER: Metoprolol Pantoprazole  (take one the night before and one on the morning of surgery - helps to prevent nausea after surgery.)  Use inhalers on the day of surgery  Follow recommendations from Cardiologist, Pulmonologist or PCP regarding stopping Aspirin, Coumadin, Plavix, Eliquis, Pradaxa, or Pletal. Last dose Aspirin last dose June 13 and last of plavix June 15 , 2021.  Stop Anti-inflammatories (NSAIDS) such as Advil, Aleve, Ibuprofen, Motrin, Naproxen, Naprosyn and Aspirin based products such as Excedrin, Goodys Powder, BC Powder. (May take Tylenol or Acetaminophen if needed.)  Stop ANY OVER THE COUNTER  supplements until after surgery. (May continue Vitamin D, Vitamin B, and multivitamin.)  No Alcohol for 24 hours before or after surgery.  No Smoking including e-cigarettes for 24 hours prior to surgery.  No chewable tobacco products for at least 6 hours prior to surgery.  No nicotine patches on the day of surgery.  Do not use any "recreational" drugs for at least a week prior to your surgery.  Please be advised that the combination of cocaine and anesthesia may have negative outcomes, up to and including death. If you test positive for cocaine, your surgery will be cancelled.  On the morning of surgery brush your teeth with toothpaste and water, you may rinse your mouth with mouthwash if you wish. Do not swallow any toothpaste or mouthwash.  Do not wear jewelry, make-up, hairpins, clips or nail polish.  Do not wear lotions, powders, or perfumes.   Do not shave 48 hours prior to surgery.   Contact lenses, hearing aids and dentures may not be worn into surgery.  Do not bring valuables to the hospital. [facility] is not responsible for any missing/lost belongings or valuables.   Shower the day of shower  Fleets enema  as directed.  Notify your doctor if there is any change in your medical condition (cold, fever, infection).  Wear comfortable clothing (specific to your surgery type) to the hospital.  If you are being admitted to the hospital overnight, leave your suitcase in the car. After surgery it may be brought to your room.  If  you are being discharged the day of surgery, you will not be allowed to drive home. You will need a responsible adult (18 years or older) to drive you home and stay with you that night.   If you are taking public transportation, you will need to have a responsible adult (18 years or older) with you. Please confirm with your physician that it is acceptable to use public transportation.   Please call the [facility] at [phone] if you have any questions about these instructions.  Visitation Policy:  Patients undergoing a surgery or procedure may have one family member or support person with them as long as that person is not COVID-19 positive or experiencing its symptoms.  That person may remain in the waiting area during the procedure.  Children under 47 years of age may have both parents or legal guardians with them during their procedure.  Inpatient Visitation Update:   Two designated support people may visit a patient during visiting hours 7 am to 8 pm. It must be the same two designated people for the duration of the patient stay. The visitors may come and go during the day, and there is no switching out to have different visitors. A mask must be worn at all times, including in the patient room.  Children under 61 years of age:  a total of 4 designated visitors for the child's entire stay are allowed. Only 2 in the room at a time and only one staying overnight at a time. The overnight guest can now rotate during the child's hospital stay.  As a reminder, masks are still required for all [facility] team members, patients and visitors in all [facility] facilities.   Systemwide, no visitors 17 or younger.

## 2020-06-03 ENCOUNTER — Other Ambulatory Visit: Payer: Self-pay

## 2020-06-03 ENCOUNTER — Encounter
Admission: RE | Admit: 2020-06-03 | Discharge: 2020-06-03 | Disposition: A | Discharge status: Home / Self Care | Payer: Medicare Other | Source: Ambulatory Visit | Attending: Urology | Admitting: Urology

## 2020-06-03 DIAGNOSIS — I2510 Atherosclerotic heart disease of native coronary artery without angina pectoris: Secondary | ICD-10-CM | POA: Diagnosis not present

## 2020-06-03 DIAGNOSIS — Z20822 Contact with and (suspected) exposure to covid-19: Secondary | ICD-10-CM | POA: Diagnosis not present

## 2020-06-03 DIAGNOSIS — Z01818 Encounter for other preprocedural examination: Secondary | ICD-10-CM | POA: Insufficient documentation

## 2020-06-03 LAB — BASIC METABOLIC PANEL
Anion gap: 10 (ref 5–15)
BUN: 12 mg/dL (ref 8–23)
CO2: 28 mmol/L (ref 22–32)
Calcium: 9.2 mg/dL (ref 8.9–10.3)
Chloride: 104 mmol/L (ref 98–111)
Creatinine, Ser: 0.8 mg/dL (ref 0.61–1.24)
GFR calc Af Amer: >60 mL/min (ref >60)
GFR calc non Af Amer: >60 mL/min (ref >60)
Glucose, Bld: 115 mg/dL — ABNORMAL HIGH (ref 70–99)
Potassium: 4.1 mmol/L (ref 3.5–5.1)
Sodium: 142 mmol/L (ref 135–145)

## 2020-06-03 LAB — CBC
HCT: 41.8 % (ref 39.0–52.0)
Hemoglobin: 14.8 g/dL (ref 13.0–17.0)
MCH: 31.4 pg (ref 26.0–34.0)
MCHC: 35.4 g/dL (ref 30.0–36.0)
MCV: 88.7 fL (ref 80.0–100.0)
Platelets: 216 10*3/uL (ref 150–400)
RBC: 4.71 MIL/uL (ref 4.22–5.81)
RDW: 13.3 % (ref 11.5–15.5)
WBC: 7.9 10*3/uL (ref 4.0–10.5)
nRBC: 0 % (ref 0.0–0.2)

## 2020-06-03 LAB — SARS CORONAVIRUS 2 (TAT 6-24 HRS): SARS Coronavirus 2: NEGATIVE

## 2020-06-07 ENCOUNTER — Ambulatory Visit: Payer: Medicare Other

## 2020-06-07 ENCOUNTER — Encounter
Admission: RE | Disposition: A | Discharge status: Home / Self Care | Payer: Self-pay | Source: Home / Self Care | Attending: Urology

## 2020-06-07 ENCOUNTER — Encounter: Payer: Self-pay | Admitting: Urology

## 2020-06-07 ENCOUNTER — Ambulatory Visit
Admission: RE | Admit: 2020-06-07 | Discharge: 2020-06-07 | Disposition: A | Discharge status: Home / Self Care | Payer: Medicare Other | Attending: Urology | Admitting: Urology

## 2020-06-07 ENCOUNTER — Ambulatory Visit: Payer: Medicare Other | Admitting: Certified Registered"

## 2020-06-07 ENCOUNTER — Other Ambulatory Visit: Payer: Self-pay

## 2020-06-07 DIAGNOSIS — Z8249 Family history of ischemic heart disease and other diseases of the circulatory system: Secondary | ICD-10-CM | POA: Diagnosis not present

## 2020-06-07 DIAGNOSIS — Z880 Allergy status to penicillin: Secondary | ICD-10-CM | POA: Diagnosis not present

## 2020-06-07 DIAGNOSIS — Z7902 Long term (current) use of antithrombotics/antiplatelets: Secondary | ICD-10-CM | POA: Diagnosis not present

## 2020-06-07 DIAGNOSIS — Z79899 Other long term (current) drug therapy: Secondary | ICD-10-CM | POA: Insufficient documentation

## 2020-06-07 DIAGNOSIS — Z886 Allergy status to analgesic agent status: Secondary | ICD-10-CM | POA: Diagnosis not present

## 2020-06-07 DIAGNOSIS — I10 Essential (primary) hypertension: Secondary | ICD-10-CM | POA: Diagnosis not present

## 2020-06-07 DIAGNOSIS — I252 Old myocardial infarction: Secondary | ICD-10-CM | POA: Insufficient documentation

## 2020-06-07 DIAGNOSIS — C61 Malignant neoplasm of prostate: Secondary | ICD-10-CM

## 2020-06-07 DIAGNOSIS — K219 Gastro-esophageal reflux disease without esophagitis: Secondary | ICD-10-CM | POA: Insufficient documentation

## 2020-06-07 DIAGNOSIS — I2510 Atherosclerotic heart disease of native coronary artery without angina pectoris: Secondary | ICD-10-CM | POA: Insufficient documentation

## 2020-06-07 DIAGNOSIS — Z881 Allergy status to other antibiotic agents status: Secondary | ICD-10-CM | POA: Insufficient documentation

## 2020-06-07 DIAGNOSIS — D649 Anemia, unspecified: Secondary | ICD-10-CM | POA: Insufficient documentation

## 2020-06-07 DIAGNOSIS — Z955 Presence of coronary angioplasty implant and graft: Secondary | ICD-10-CM | POA: Insufficient documentation

## 2020-06-07 DIAGNOSIS — Z7982 Long term (current) use of aspirin: Secondary | ICD-10-CM | POA: Insufficient documentation

## 2020-06-07 HISTORY — PX: CYSTOSCOPY: SHX5120

## 2020-06-07 HISTORY — PX: RADIOACTIVE SEED IMPLANT: SHX5150

## 2020-06-07 SURGERY — INSERTION, RADIATION SOURCE, PROSTATE
Anesthesia: General

## 2020-06-07 MED ORDER — FENTANYL CITRATE (PF) 100 MCG/2ML IJ SOLN
INTRAMUSCULAR | Status: DC | PRN
  Administered 2020-06-07: 25 ug via INTRAVENOUS
  Administered 2020-06-07: 50 ug via INTRAVENOUS
  Administered 2020-06-07: 25 ug via INTRAVENOUS

## 2020-06-07 MED ORDER — FENTANYL CITRATE (PF) 100 MCG/2ML IJ SOLN
INTRAMUSCULAR | Status: AC
  Filled 2020-06-07: qty 2

## 2020-06-07 MED ORDER — SEVOFLURANE IN SOLN
RESPIRATORY_TRACT | Status: AC
  Filled 2020-06-07: qty 250

## 2020-06-07 MED ORDER — DEXAMETHASONE SODIUM PHOSPHATE 10 MG/ML IJ SOLN
INTRAMUSCULAR | Status: AC
  Filled 2020-06-07: qty 1

## 2020-06-07 MED ORDER — CIPROFLOXACIN HCL 500 MG PO TABS
500.0000 mg | ORAL_TABLET | Freq: Two times a day (BID) | ORAL | 0 refills | Status: DC
Start: 2020-06-07 — End: 2020-07-09

## 2020-06-07 MED ORDER — CIPROFLOXACIN IN D5W 400 MG/200ML IV SOLN
400.0000 mg | INTRAVENOUS | Status: AC
  Administered 2020-06-07: 400 mg via INTRAVENOUS

## 2020-06-07 MED ORDER — FENTANYL CITRATE (PF) 100 MCG/2ML IJ SOLN: 25.0000 ug | INTRAMUSCULAR | Status: DC | PRN

## 2020-06-07 MED ORDER — ONDANSETRON HCL 4 MG/2ML IJ SOLN
INTRAMUSCULAR | Status: AC
  Filled 2020-06-07: qty 2

## 2020-06-07 MED ORDER — PROPOFOL 10 MG/ML IV BOLUS
INTRAVENOUS | Status: DC | PRN
  Administered 2020-06-07: 150 mg via INTRAVENOUS

## 2020-06-07 MED ORDER — DEXAMETHASONE SODIUM PHOSPHATE 10 MG/ML IJ SOLN
INTRAMUSCULAR | Status: DC | PRN
  Administered 2020-06-07: 5 mg via INTRAVENOUS

## 2020-06-07 MED ORDER — ONDANSETRON HCL 4 MG/2ML IJ SOLN: 4.0000 mg | Freq: Once | INTRAMUSCULAR | Status: DC | PRN

## 2020-06-07 MED ORDER — ROCURONIUM BROMIDE 100 MG/10ML IV SOLN
INTRAVENOUS | Status: DC | PRN
  Administered 2020-06-07: 30 mg via INTRAVENOUS

## 2020-06-07 MED ORDER — FENTANYL CITRATE (PF) 250 MCG/5ML IJ SOLN
INTRAMUSCULAR | Status: AC
  Filled 2020-06-07: qty 5

## 2020-06-07 MED ORDER — TAMSULOSIN HCL 0.4 MG PO CAPS
0.4000 mg | ORAL_CAPSULE | Freq: Every day | ORAL | 0 refills | Status: DC
Start: 2020-06-07 — End: 2020-07-07

## 2020-06-07 MED ORDER — BACITRACIN ZINC 500 UNIT/GM EX OINT
TOPICAL_OINTMENT | CUTANEOUS | Status: AC
  Filled 2020-06-07: qty 28.35

## 2020-06-07 MED ORDER — CIPROFLOXACIN IN D5W 400 MG/200ML IV SOLN
INTRAVENOUS | Status: AC
  Filled 2020-06-07: qty 200

## 2020-06-07 MED ORDER — CHLORHEXIDINE GLUCONATE 0.12 % MT SOLN: 15.0000 mL | Freq: Once | OROMUCOSAL | Status: AC

## 2020-06-07 MED ORDER — BACITRACIN 500 UNIT/GM EX OINT
TOPICAL_OINTMENT | CUTANEOUS | Status: DC | PRN
  Administered 2020-06-07: 1 via TOPICAL

## 2020-06-07 MED ORDER — FLEET ENEMA 7-19 GM/118ML RE ENEM: 1.0000 | ENEMA | Freq: Once | RECTAL | Status: DC

## 2020-06-07 MED ORDER — LIDOCAINE HCL (PF) 2 % IJ SOLN
INTRAMUSCULAR | Status: AC
  Filled 2020-06-07: qty 5

## 2020-06-07 MED ORDER — CHLORHEXIDINE GLUCONATE 0.12 % MT SOLN
OROMUCOSAL | Status: AC
  Administered 2020-06-07: 15 mL via OROMUCOSAL
  Filled 2020-06-07: qty 15

## 2020-06-07 MED ORDER — ORAL CARE MOUTH RINSE: 15.0000 mL | Freq: Once | OROMUCOSAL | Status: AC

## 2020-06-07 MED ORDER — LIDOCAINE HCL (CARDIAC) PF 100 MG/5ML IV SOSY
PREFILLED_SYRINGE | INTRAVENOUS | Status: DC | PRN
  Administered 2020-06-07: 50 mg via INTRAVENOUS

## 2020-06-07 MED ORDER — PHENYLEPHRINE HCL (PRESSORS) 10 MG/ML IV SOLN
INTRAVENOUS | Status: DC | PRN
  Administered 2020-06-07 (×5): 100 ug via INTRAVENOUS

## 2020-06-07 MED ORDER — SUCCINYLCHOLINE CHLORIDE 200 MG/10ML IV SOSY
PREFILLED_SYRINGE | INTRAVENOUS | Status: AC
  Filled 2020-06-07: qty 10

## 2020-06-07 MED ORDER — LACTATED RINGERS IV SOLN
INTRAVENOUS | Status: DC
  Administered 2020-06-07: 50 mL/h via INTRAVENOUS

## 2020-06-07 MED ORDER — SUCCINYLCHOLINE CHLORIDE 20 MG/ML IJ SOLN
INTRAMUSCULAR | Status: DC | PRN
  Administered 2020-06-07: 120 mg via INTRAVENOUS

## 2020-06-07 MED ORDER — ONDANSETRON HCL 4 MG/2ML IJ SOLN
INTRAMUSCULAR | Status: DC | PRN
  Administered 2020-06-07: 4 mg via INTRAVENOUS

## 2020-06-07 MED ORDER — ROCURONIUM BROMIDE 10 MG/ML (PF) SYRINGE
PREFILLED_SYRINGE | INTRAVENOUS | Status: AC
  Filled 2020-06-07: qty 10

## 2020-06-07 MED ORDER — PROPOFOL 10 MG/ML IV BOLUS
INTRAVENOUS | Status: AC
  Filled 2020-06-07: qty 20

## 2020-06-07 MED ORDER — SUGAMMADEX SODIUM 200 MG/2ML IV SOLN
INTRAVENOUS | Status: DC | PRN
  Administered 2020-06-07: 200 mg via INTRAVENOUS

## 2020-06-07 MED ORDER — MIDAZOLAM HCL 2 MG/2ML IJ SOLN
INTRAMUSCULAR | Status: DC | PRN
  Administered 2020-06-07: 2 mg via INTRAVENOUS

## 2020-06-07 MED ORDER — MIDAZOLAM HCL 2 MG/2ML IJ SOLN
INTRAMUSCULAR | Status: AC
  Filled 2020-06-07: qty 2

## 2020-06-07 SURGICAL SUPPLY — 25 items
BAG URINE DRAIN 2000ML AR STRL (UROLOGICAL SUPPLIES) ×8 IMPLANT
BLADE CLIPPER SURG (BLADE) ×4 IMPLANT
CATH FOL 2WAY LX 16X5 (CATHETERS) ×4 IMPLANT
CATH FOL 2WAY LX 18X30 (CATHETERS) ×4 IMPLANT
COVER BACK TABLE REUSABLE LG (DRAPES) ×4 IMPLANT
DRAPE INCISE 23X17 IOBAN STRL (DRAPES) ×2
DRAPE INCISE IOBAN 23X17 STRL (DRAPES) ×2 IMPLANT
DRAPE UNDER BUTTOCK W/FLU (DRAPES) ×4 IMPLANT
DRSG TELFA 3X8 NADH (GAUZE/BANDAGES/DRESSINGS) ×4 IMPLANT
GLOVE BIO SURGEON STRL SZ 6.5 (GLOVE) ×6 IMPLANT
GLOVE BIO SURGEON STRL SZ7.5 (GLOVE) ×8 IMPLANT
GLOVE BIO SURGEONS STRL SZ 6.5 (GLOVE) ×2
GOWN STRL REUS W/ TWL LRG LVL3 (GOWN DISPOSABLE) ×4 IMPLANT
GOWN STRL REUS W/ TWL XL LVL3 (GOWN DISPOSABLE) ×2 IMPLANT
GOWN STRL REUS W/TWL LRG LVL3 (GOWN DISPOSABLE) ×4
GOWN STRL REUS W/TWL XL LVL3 (GOWN DISPOSABLE) ×2
HOLDER FOLEY CATH W/STRAP (MISCELLANEOUS) ×4 IMPLANT
IV NS 1000ML (IV SOLUTION) ×2
IV NS 1000ML BAXH (IV SOLUTION) ×2 IMPLANT
KIT TURNOVER CYSTO (KITS) ×4 IMPLANT
PACK CYSTO AR (MISCELLANEOUS) ×4 IMPLANT
SET CYSTO W/LG BORE CLAMP LF (SET/KITS/TRAYS/PACK) ×4 IMPLANT
SURGILUBE 2OZ TUBE FLIPTOP (MISCELLANEOUS) ×4 IMPLANT
SYR 10ML LL (SYRINGE) ×4 IMPLANT
WATER STERILE IRR 1000ML POUR (IV SOLUTION) ×4 IMPLANT

## 2020-06-07 NOTE — Anesthesia Preprocedure Evaluation (Signed)
Anesthesia Evaluation  Patient identified by MRN, date of birth, ID band Patient awake    Reviewed: Allergy & Precautions, NPO status , Patient's Chart, lab work & pertinent test results  History of Anesthesia Complications Negative for: history of anesthetic complications  Airway Mallampati: II       Dental   Pulmonary neg sleep apnea, neg COPD, Not current smoker,           Cardiovascular hypertension, Pt. on medications + Past MI and + Cardiac Stents  (-) CHF (-) dysrhythmias (-) Valvular Problems/Murmurs     Neuro/Psych neg Seizures    GI/Hepatic Neg liver ROS, GERD  Medicated and Controlled,  Endo/Other  neg diabetes  Renal/GU negative Renal ROS     Musculoskeletal   Abdominal   Peds  Hematology  (+) anemia ,   Anesthesia Other Findings   Reproductive/Obstetrics                             Anesthesia Physical Anesthesia Plan  ASA: III  Anesthesia Plan: General   Post-op Pain Management:    Induction: Intravenous  PONV Risk Score and Plan: 2 and Ondansetron and Dexamethasone  Airway Management Planned: Oral ETT  Additional Equipment:   Intra-op Plan:   Post-operative Plan:   Informed Consent: I have reviewed the patients History and Physical, chart, labs and discussed the procedure including the risks, benefits and alternatives for the proposed anesthesia with the patient or authorized representative who has indicated his/her understanding and acceptance.       Plan Discussed with:   Anesthesia Plan Comments:         Anesthesia Quick Evaluation

## 2020-06-07 NOTE — OR Nursing (Signed)
Per Dr. Erlene Quan, secure chat, pt may resume aspirin "when his urine is clear"; added to d/c instructions/med section.

## 2020-06-07 NOTE — Transfer of Care (Signed)
Immediate Anesthesia Transfer of Care Note  Patient: Kenneth Mayo  Procedure(s) Performed: RADIOACTIVE SEED IMPLANT/BRACHYTHERAPY IMPLANT (N/A ) CYSTOSCOPY  Patient Location: PACU  Anesthesia Type:General  Level of Consciousness: sedated  Airway & Oxygen Therapy: Patient Spontanous Breathing and Patient connected to face mask oxygen  Post-op Assessment: Report given to RN  Post vital signs: stable  Last Vitals:  Vitals Value Taken Time  BP    Temp    Pulse 72 06/07/20 0853  Resp 14 06/07/20 0853  SpO2 96 % 06/07/20 0853  Vitals shown include unvalidated device data.  Last Pain:  Vitals:   06/07/20 0620  TempSrc: Tympanic  PainSc: 0-No pain         Complications: No complications documented.

## 2020-06-07 NOTE — Anesthesia Procedure Notes (Signed)
Procedure Name: Intubation Date/Time: 06/07/2020 7:49 AM Performed by: Lerry Liner, CRNA Pre-anesthesia Checklist: Patient identified, Emergency Drugs available, Suction available, Patient being monitored and Timeout performed Patient Re-evaluated:Patient Re-evaluated prior to induction Oxygen Delivery Method: Circle system utilized Preoxygenation: Pre-oxygenation with 100% oxygen Induction Type: IV induction Ventilation: Mask ventilation without difficulty Laryngoscope Size: McGraph and 4 Grade View: Grade II Tube type: Oral Tube size: 7.5 mm Number of attempts: 1 Airway Equipment and Method: Stylet Placement Confirmation: ETT inserted through vocal cords under direct vision Secured at: 22 cm Tube secured with: Tape Dental Injury: Teeth and Oropharynx as per pre-operative assessment

## 2020-06-07 NOTE — Discharge Instructions (Addendum)
Brachytherapy for Prostate Cancer, Care After  This sheet gives you information about how to care for yourself after your procedure. Your health care provider may also give you more specific instructions. If you have problems or questions, contact your health care provider. What can I expect after the procedure? After the procedure, it is common to have:  Trouble passing urine.  Blood in the urine or semen.  Constipation.  Frequent feeling of an urgent need to urinate.  Bruising, swelling, and tenderness of the area behind the scrotum (perineum).  Bloating and gas.  Fatigue.  Burning or pain in the rectum.  Problems getting or keeping an erection (erectile dysfunction).  Nausea. Follow these instructions at home: Managing pain, stiffness, and swelling  If directed, apply ice to the affected area: ? Put ice in a plastic bag. ? Place a towel between your skin and the bag. ? Leave the ice on for 20 minutes, 2-3 times a day.  Try not to sit directly on the area behind the scrotum. A soft cushion can help with discomfort. Activity  Do not drive for 24 hours if you were given a medicine to help you relax (sedative).  Do not drive or use heavy machinery while taking prescription pain medicine.  Rest as told by your health care provider.  Most people can return to normal activities a few days or weeks after the procedure. Ask your health care provider what activities are safe for you. Eating and drinking  Drink enough fluid to keep your urine clear or pale yellow.  Eat a healthy, balanced diet. This includes lean proteins, whole grains, and plenty of fruits and vegetables. General instructions  Take over-the-counter and prescription medicines only as told by your health care provider.  Keep all follow-up visits as told by your health care provider. This is important. You may still need additional treatment.  Do not take baths, swim, or use a hot tub until your health  care provider approves. Shower and wash the area behind the scrotum gently.  Do not have sex for one week after the treatment, or until your health care provider approves.  If you have permanent, low-dose brachytherapy implants: ? Limit close contact with children and pregnant women for 2 months or as told by your health care provider. This is important because of the radiation that is still active in the prostate. ? You may set off radioactive sensors, such as airport screenings. Ask your health care provider for a document that explains your treatment. ? You may be instructed to use a condom during sex for the first 2 months after low-dose brachytherapy. Contact a health care provider if:  You have a fever or chills.  You do not have a bowel movement for 3-4 days after the procedure.  You have diarrhea for 3-4 days after the procedure.  You develop any new symptoms, such as problems with urinating or erectile dysfunction.  You have abdomen (abdominal) pain.  You have more blood in your urine. Get help right away if:  You cannot urinate.  There is excessive bleeding from your rectum.  You have unusual drainage coming from your rectum.  You have severe pain in the treated area that does not go away with pain medicine.  You have severe nausea or vomiting. Summary  If you have permanent, low-dose brachytherapy implants, limit close contact with children and pregnant women for 2 months or as told by your health care provider. This is important because of the radiation   that is still active in the prostate.  Talk with your health care provider about your risk of brachytherapy side effects, such as erectile dysfunction or urinary problems. Your health care provider will be able to recommend possible treatment options.  Keep all follow-up visits as told by your health care provider. This is important. You may need additional treatment. This information is not intended to replace  advice given to you by your health care provider. Make sure you discuss any questions you have with your health care provider. Document Revised: 11/16/2017 Document Reviewed: 01/05/2017 Elsevier Patient Education  2020 [employer]    Indwelling Urinary Catheter Care, Adult An indwelling urinary catheter is a thin tube that is put into your bladder. The tube helps to drain pee (urine) out of your body. The tube goes in through your urethra. Your urethra is where pee comes out of your body. Your pee will come out through the catheter, then it will go into a bag (drainage bag). Take good care of your catheter so it will work well. How to wear your catheter and bag Supplies needed  Sticky tape (adhesive tape) or a leg strap.  Alcohol wipe or soap and water (if you use tape).  A clean towel (if you use tape).  Large overnight bag.  Smaller bag (leg bag). Wearing your catheter Attach your catheter to your leg with tape or a leg strap.  Make sure the catheter is not pulled tight.  If a leg strap gets wet, take it off and put on a dry strap.  If you use tape to hold the bag on your leg: 1. Use an alcohol wipe or soap and water to wash your skin where the tape made it sticky before. 2. Use a clean towel to pat-dry that skin. 3. Use new tape to make the bag stay on your leg. Wearing your bags You should have been given a large overnight bag.  You may wear the overnight bag in the day or night.  Always have the overnight bag lower than your bladder.  Do not let the bag touch the floor.  Before you go to sleep, put a clean plastic bag in a wastebasket. Then hang the overnight bag inside the wastebasket. You should also have a smaller leg bag that fits under your clothes.  Always wear the leg bag below your knee.  Do not wear your leg bag at night. How to care for your skin and catheter Supplies needed  A clean washcloth.  Water and mild soap.  A clean towel. Caring for  your skin and catheter      Clean the skin around your catheter every day: 1. Wash your hands with soap and water. 2. Wet a clean washcloth in warm water and mild soap. 3. Clean the skin around your urethra.  If you are male:  Gently spread the folds of skin around your vagina (labia).  With the washcloth in your other hand, wipe the inner side of your labia on each side. Wipe from front to back.  If you are male:  Pull back any skin that covers the end of your penis (foreskin).  With the washcloth in your other hand, wipe your penis in small circles. Start wiping at the tip of your penis, then move away from the catheter.  Move the foreskin back in place, if needed. 4. With your free hand, hold the catheter close to where it goes into your body.  Keep holding the catheter   during cleaning so it does not get pulled out. 5. With the washcloth in your other hand, clean the catheter.  Only wipe downward on the catheter.  Do not wipe upward toward your body. Doing this may push germs into your urethra and cause infection. 6. Use a clean towel to pat-dry the catheter and the skin around it. Make sure to wipe off all soap. 7. Wash your hands with soap and water.  Shower every day. Do not take baths.  Do not use cream, ointment, or lotion on the area where the catheter goes into your body, unless your doctor tells you to.  Do not use powders, sprays, or lotions on your genital area.  Check your skin around the catheter every day for signs of infection. Check for: ? Redness, swelling, or pain. ? Fluid or blood. ? Warmth. ? Pus or a bad smell. How to empty the bag Supplies needed  Rubbing alcohol.  Gauze pad or cotton ball.  Tape or a leg strap. Emptying the bag Pour the pee out of your bag when it is ?- full, or at least 2-3 times a day. Do this for your overnight bag and your leg bag. 1. Wash your hands with soap and water. 2. Separate (detach) the bag from your  leg. 3. Hold the bag over the toilet or a clean pail. Keep the bag lower than your hips and bladder. This is so the pee (urine) does not go back into the tube. 4. Open the pour spout. It is at the bottom of the bag. 5. Empty the pee into the toilet or pail. Do not let the pour spout touch any surface. 6. Put rubbing alcohol on a gauze pad or cotton ball. 7. Use the gauze pad or cotton ball to clean the pour spout. 8. Close the pour spout. 9. Attach the bag to your leg with tape or a leg strap. 10. Wash your hands with soap and water. Follow instructions for cleaning the drainage bag:  From the product maker.  As told by your doctor. How to change the bag Supplies needed  Alcohol wipes.  A clean bag.  Tape or a leg strap. Changing the bag Replace your bag when it starts to leak, smell bad, or look dirty. 1. Wash your hands with soap and water. 2. Separate the dirty bag from your leg. 3. Pinch the catheter with your fingers so that pee does not spill out. 4. Separate the catheter tube from the bag tube where these tubes connect (at the connection valve). Do not let the tubes touch any surface. 5. Clean the end of the catheter tube with an alcohol wipe. Use a different alcohol wipe to clean the end of the bag tube. 6. Connect the catheter tube to the tube of the clean bag. 7. Attach the clean bag to your leg with tape or a leg strap. Do not make the bag tight on your leg. 8. Wash your hands with soap and water. General rules   Never pull on your catheter. Never try to take it out. Doing that can hurt you.  Always wash your hands before and after you touch your catheter or bag. Use a mild, fragrance-free soap. If you do not have soap and water, use hand sanitizer.  Always make sure there are no twists or bends (kinks) in the catheter tube.  Always make sure there are no leaks in the catheter or bag.  Drink enough fluid to keep your pee pale   yellow.  Do not take baths, swim,  or use a hot tub.  If you are male, wipe from front to back after you poop (have a bowel movement). Contact a doctor if:  Your pee is cloudy.  Your pee smells worse than usual.  Your catheter gets clogged.  Your catheter leaks.  Your bladder feels full. Get help right away if:  You have redness, swelling, or pain where the catheter goes into your body.  You have fluid, blood, pus, or a bad smell coming from the area where the catheter goes into your body.  Your skin feels warm where the catheter goes into your body.  You have a fever.  You have pain in your: ? Belly (abdomen). ? Legs. ? Lower back. ? Bladder.  You see blood in the catheter.  Your pee is pink or red.  You feel sick to your stomach (nauseous).  You throw up (vomit).  You have chills.  Your pee is not draining into the bag.  Your catheter gets pulled out. Summary  An indwelling urinary catheter is a thin tube that is placed into the bladder to help drain pee (urine) out of the body.  The catheter is placed into the part of the body that drains pee from the bladder (urethra).  Taking good care of your catheter will keep it working properly and help prevent problems.  Always wash your hands before and after touching your catheter or bag.  Never pull on your catheter or try to take it out. This information is not intended to replace advice given to you by your health care provider. Make sure you discuss any questions you have with your health care provider. Document Revised: 03/28/2019 Document Reviewed: 07/20/2017 Elsevier Patient Education  2020 [employer]   AMBULATORY SURGERY  DISCHARGE INSTRUCTIONS   1) The drugs that you were given will stay in your system until tomorrow so for the next 24 hours you should not:  A) Drive an automobile B) Make any legal decisions C) Drink any alcoholic beverage   2) You may resume regular meals tomorrow.  Today it is better to start with  liquids and gradually work up to solid foods.  You may eat anything you prefer, but it is better to start with liquids, then soup and crackers, and gradually work up to solid foods.   3) Please notify your doctor immediately if you have any unusual bleeding, trouble breathing, redness and pain at the surgery site, drainage, fever, or pain not relieved by medication.    4) Additional Instructions:        Please contact your physician with any problems or Same Day Surgery at [phone], Monday through Friday 6 am to 4 pm, or [facility] [place] Main number at [phone]. 

## 2020-06-07 NOTE — Op Note (Signed)
Preoperative diagnosis: Adenocarcinoma of the prostate   Postoperative diagnosis: Same   Procedure: I-125 prostate seed implantation, cystoscopy, urethral foreign body removal (brachytherapy seed x2)  Surgeon: Hollice Espy M.D. ,   Radiation oncologist: Lavena Stanford, M.D.   Anesthesia: General  Drains: 18 French Foley catheter with 30 cc balloon  Complications: none  Indications: Intermediate risk prostate cancer  Procedure: Patient was brought to operating suite and placement table in the supine position. At this time, a universal timeout protocol was performed, all team members were identified, Venodyne boots are placed, and he was administered IV Ancef in the preoperative period. He was placed in lithotomy position and prepped and draped in usual manner. Radiation oncology department placed a transrectal ultrasound probe anchoring stand/ grid and aligned with previous imaging from the volume study. Foley catheter was inserted without difficulty.  All needle passage was done with real-time transrectal ultrasound guidance in both the transverse and sagittal plains in order to achieve the desired preplanned position. A total of 25 needles were placed.  91 active seeds were implanted. The Foley catheter was removed and a rigid cystoscopy revealed a submucosal brachytherapy strand of seeds just to the patient's left prostatic urethra just distal to the verumontanum.  To the proximity of the urethral sphincter as well as very submucosal location, we elected to remove the seeds.  Graspers were used to unroofed the mucosa and grasped this each hand.  This was pulled out of the bladder and passed off the field.  There were total of 2 brachytherapy seeds in the strand.  The total number of implanted seeds are remain at the end of the procedure was 89.  The bladder was drained.  Due to some mild urethral bleeding, I elected to place a 37 French Foley catheter with 30 cc balloon.  A fluoroscopic image  was then obtained showing excellent distrubution of the brachytherapy seeds.  Each seed was counted and counts were correct.    The patient was then repositioned in the supine position, reversed from anesthesia, and taken to the PACU in stable condition.  We will have him return to the office tomorrow for Foley catheter removal.

## 2020-06-07 NOTE — Interval H&P Note (Signed)
H&P up-to-date, no changes  Regular rate and rhythm Clear to auscultation bilaterally

## 2020-06-07 NOTE — Progress Notes (Signed)
   06/07/20 0740  Clinical Encounter Type  Visited With Family  Visit Type Initial  Referral From Other (Comment)  Consult/Referral To Chaplain  While rounding [facility] waiting area, chaplain s/w patient's wife. Mrs. Kroon said that she has not be allowed to see the doctor because of [facility]. Wife said that patient doesn't always ask questions. Chaplain could understand that and told wife that she is the one who ask the questions and is usually talking to doctor on a phone (via speaker). Mrs. Wilkie said that she thought that she would have to sit in her car and was surprised when she was allowed in. Chaplain confirmed that some restrictions have been lifted and that is why she was able to come in. Chaplain will check on her later.

## 2020-06-07 NOTE — Anesthesia Postprocedure Evaluation (Signed)
Anesthesia Post Note  Patient: Kenneth Mayo  Procedure(s) Performed: RADIOACTIVE SEED IMPLANT/BRACHYTHERAPY IMPLANT (N/A ) CYSTOSCOPY  Patient location during evaluation: PACU Anesthesia Type: General Level of consciousness: awake and alert Pain management: pain level controlled Vital Signs Assessment: post-procedure vital signs reviewed and stable Respiratory status: spontaneous breathing and respiratory function stable Cardiovascular status: stable Anesthetic complications: no   No complications documented.   Last Vitals:  Vitals:   06/07/20 0924 06/07/20 0947  BP: 140/72 (!) 145/96  Pulse: 79 77  Resp: 20 18  Temp: (!) 36.1 C (!) 36.1 C  SpO2: 96% 97%    Last Pain:  Vitals:   06/07/20 0947  TempSrc: Temporal  PainSc: 0-No pain                 Xavien Dauphinais K

## 2020-06-07 NOTE — Progress Notes (Signed)
Radiation Oncology I-125 interstitial implant note  Name: Kenneth Mayo   Date:   03/09/2020 MRN:  [id number] DOB: 04-18-1956    This 64 y.o. male presents to the hospital for I-125 interstitial implant for a Gleason 7 (3+4) adenocarcinoma the prostate REFERRING PROVIDER: No ref. provider found  HPI: Patient is a 64 year old male taken to the OR today for I-125 interstitial implant for Gleason 7 (3+4) adenocarcinoma the prostate presenting with a PSA of 7.4..  COMPLICATIONS OF TREATMENT: none  FOLLOW UP COMPLIANCE: keeps appointments   PHYSICAL EXAM:  BP (!) 142/79   Pulse 78   Temp (!) 97 F (36.1 C) (Temporal)   Resp 16   Ht 6\' 2"  (1.88 m)   Wt 244 lb 14.9 oz (111.1 kg)   SpO2 97%   BMI 31.45 kg/m  Well-developed well-nourished patient in NAD. HEENT reveals PERLA, EOMI, discs not visualized.  Oral cavity is clear. No oral mucosal lesions are identified. Neck is clear without evidence of cervical or supraclavicular adenopathy. Lungs are clear to A&P. Cardiac examination is essentially unremarkable with regular rate and rhythm without murmur rub or thrill. Abdomen is benign with no organomegaly or masses noted. Motor sensory and DTR levels are equal and symmetric in the upper and lower extremities. Cranial nerves II through XII are grossly intact. Proprioception is intact. No peripheral adenopathy or edema is identified. No motor or sensory levels are noted. Crude visual fields are within normal range.  RADIOLOGY RESULTS: Ultrasound used for implant.  PLAN: Patient was taken to the operating room and general anesthesia was administered. Legs were immobilized in stirrups and patient was positioned in the exact same proportions as original volume study. Patient was prepped and Foley catheter was placed. Ultrasound guidance identified the prostate and recreated the original set up as per treatment planning volume study. 25 needles were placed under ultrasound guidance with PVCs delivered to  the prostate volume. After completion of procedure cystoscopy was performed by urology and no evidence of seeds in the bladder were noted. Patient tolerated the procedure extremely well. Initial plain film as doublecheck identified 89 seeds in the prostate. Patient has followup appointment in one month for CT scan for quality assurance will be performed.    Noreene Filbert, MD

## 2020-06-08 ENCOUNTER — Encounter: Payer: Self-pay | Admitting: Urology

## 2020-07-06 ENCOUNTER — Ambulatory Visit: Payer: 59 | Admitting: Radiation Oncology

## 2020-07-06 ENCOUNTER — Ambulatory Visit: Payer: Medicare Other

## 2020-07-07 ENCOUNTER — Ambulatory Visit
Admission: RE | Admit: 2020-07-07 | Discharge: 2020-07-07 | Disposition: A | Discharge status: Home / Self Care | Payer: Medicare Other | Source: Ambulatory Visit | Attending: Radiation Oncology | Admitting: Radiation Oncology

## 2020-07-07 ENCOUNTER — Other Ambulatory Visit: Payer: Self-pay

## 2020-07-07 ENCOUNTER — Other Ambulatory Visit: Payer: Self-pay | Admitting: Licensed Clinical Social Worker

## 2020-07-07 ENCOUNTER — Encounter: Payer: Self-pay | Admitting: Radiation Oncology

## 2020-07-07 VITALS — BP 149/89 | HR 86 | Temp 97.8°F | Wt 253.0 lb

## 2020-07-07 DIAGNOSIS — Z923 Personal history of irradiation: Secondary | ICD-10-CM | POA: Diagnosis not present

## 2020-07-07 DIAGNOSIS — Z7982 Long term (current) use of aspirin: Secondary | ICD-10-CM | POA: Insufficient documentation

## 2020-07-07 DIAGNOSIS — I2510 Atherosclerotic heart disease of native coronary artery without angina pectoris: Secondary | ICD-10-CM | POA: Insufficient documentation

## 2020-07-07 DIAGNOSIS — C61 Malignant neoplasm of prostate: Secondary | ICD-10-CM | POA: Insufficient documentation

## 2020-07-07 DIAGNOSIS — R350 Frequency of micturition: Secondary | ICD-10-CM

## 2020-07-07 DIAGNOSIS — I499 Cardiac arrhythmia, unspecified: Secondary | ICD-10-CM | POA: Insufficient documentation

## 2020-07-07 DIAGNOSIS — R351 Nocturia: Secondary | ICD-10-CM | POA: Insufficient documentation

## 2020-07-07 DIAGNOSIS — R197 Diarrhea, unspecified: Secondary | ICD-10-CM | POA: Insufficient documentation

## 2020-07-07 DIAGNOSIS — Z79899 Other long term (current) drug therapy: Secondary | ICD-10-CM | POA: Insufficient documentation

## 2020-07-07 MED ORDER — TAMSULOSIN HCL 0.4 MG PO CAPS: 0.4000 mg | ORAL_CAPSULE | Freq: Two times a day (BID) | ORAL | 0 refills | Status: DC

## 2020-07-07 NOTE — Progress Notes (Signed)
Radiation Oncology Follow up Note  Name: Kenneth Mayo   Date:   07/07/2020 MRN:  [id number] DOB: 1956/05/14    This 64 y.o. male presents to the clinic today for 1 month follow-up status post I-125 interstitial implant for Gleason 7 (3+4) adenocarcinoma the prostate.  REFERRING PROVIDER: Tracie Harrier, MD  HPI: Patient is a 64 year old male now at 1 month having completed I-125 interstitial implant for Gleason 7 (3+4) adenocarcinoma the prostate presenting with a PSA of 7.4 seen today in routine follow-up he is doing fairly well states he does have significant urgency frequency and nocturia.  He is not on medication for that.  He is having no GI symptoms at this time no significant for fatigue..  COMPLICATIONS OF TREATMENT: none  FOLLOW UP COMPLIANCE: keeps appointments   PHYSICAL EXAM:  BP (!) 149/89 (BP Location: Left Arm, Patient Position: Sitting, Cuff Size: Normal)   Pulse 86   Temp 97.8 F (36.6 C) (Tympanic)   Wt 253 lb (114.8 kg)   BMI 32.48 kg/m  Well-developed well-nourished patient in NAD. HEENT reveals PERLA, EOMI, discs not visualized.  Oral cavity is clear. No oral mucosal lesions are identified. Neck is clear without evidence of cervical or supraclavicular adenopathy. Lungs are clear to A&P. Cardiac examination is essentially unremarkable with regular rate and rhythm without murmur rub or thrill. Abdomen is benign with no organomegaly or masses noted. Motor sensory and DTR levels are equal and symmetric in the upper and lower extremities. Cranial nerves II through XII are grossly intact. Proprioception is intact. No peripheral adenopathy or edema is identified. No motor or sensory levels are noted. Crude visual fields are within normal range.  RADIOLOGY RESULTS: CT scan for quality assurance reviewed shows good placement of sources  PLAN: Present time I am starting him on Flomax for his lower urinary tract symptoms.  I have assured him that he is currently undergoing  treatment with hot radiation sources in his prostate causing some of his symptoms which should resolve after completion of the next month.  Prescription for Flomax was given.  I have asked to see him back in 3 to 4 months with a PSA.  Patient knows to call sooner with any concerns.  I would like to take this opportunity to thank you for allowing me to participate in the care of your patient.Noreene Filbert, MD

## 2020-07-08 NOTE — Progress Notes (Signed)
07/09/2020 10:43 AM   Kenneth Mayo Apr 13, 1956 [id number]  Referring provider: Tracie Harrier, MD [address] [facility] [place],  [place] [address] Chief Complaint  Patient presents with  . Prostate Cancer    4wk post op follow up    HPI: Kenneth Mayo is a 64 y.o. male with prostate cancer s/p radioactive seed implant on 06/07/2020 is seen today for a 4 week post op follow-up.  Please see previous notes for details.  He has a known history of elevated PSA and has been followed for many years.  He was diagnosed with low risk prostate cancer in 2018 and has been followed closely on active surveillance.  Recently, his PSA is risen up to 19 but quickly trended back downwards to 7.4.  He underwent MR in 06/10/2019 which showed a 13 mm lesion, PI-RADS 4 suspicion for high-grade microscopic prostate cancer in the left mid peripheral zone.  There is no evidence of extracapsular extension or seminal vesicle invasion or metastatic disease.  He underwent MRI fusion biopsy.  This revealed Gleason 3+4 involving 2 of 3 cores, 20% of which only 20% was Gleason 4 pattern in the area of interest.  He also had an additional 3 foci on the left with Gleason 3+4, 4+3.  He 2 foci of 3+3 on the right.  Patient elected proceed with brachytherapy and ADT for management of his prostate cancer. He underwent radioactive seed implant on 06/07/2020.  He saw Dr. Baruch Gouty on 07/07/2020 for follow up. He was started on Flomax for lower urinary tract symptoms. Dr. Oren Bracket discussed treatment with hot radiation sources in his prostate were causing some of his symptoms which should resolve after completion of the next month.  Dose increased to bid and doing better.    He reports this last month was rough. He has adequate emptying but has some bothersome urinary frequency symptoms. He is experiencing some hot flashes.   He feels fatigued. He has insomnia, last night I slept from 4-6 AM which he felt was  good for him.     IPSS    Row Name 07/09/20 1000         International Prostate Symptom Score   How often have you had the sensation of not emptying your bladder? Less than 1 in 5     How often have you had to urinate less than every two hours? Almost always     How often have you found you stopped and started again several times when you urinated? About half the time     How often have you found it difficult to postpone urination? About half the time     How often have you had a weak urinary stream? More than half the time     How often have you had to strain to start urination? About half the time     How many times did you typically get up at night to urinate? 5 Times     Total IPSS Score 24       Quality of Life due to urinary symptoms   If you were to spend the rest of your life with your urinary condition just the way it is now how would you feel about that? Unhappy            Score:  1-7 Mild 8-19 Moderate 20-35 Severe   PMH: Past Medical History:  Diagnosis Date  . Anemia   . Arrhythmia   . Asthma   .  CAD (coronary artery disease)   . Cancer [facility])    prostate cancer  . Elevated PSA   . GERD (gastroesophageal reflux disease)   . Heart attack [facility])     Surgical History: Past Surgical History:  Procedure Laterality Date  . CORONARY ANGIOPLASTY WITH STENT PLACEMENT  2010  . CYSTOSCOPY  06/07/2020   Procedure: CYSTOSCOPY;  Surgeon: Hollice Espy, MD;  Location: [facility] ORS;  Service: Urology;;  . KNEE SURGERY Right 1998  . NASAL SINUS SURGERY    . RADIOACTIVE SEED IMPLANT N/A 06/07/2020   Procedure: RADIOACTIVE SEED IMPLANT/BRACHYTHERAPY IMPLANT;  Surgeon: Hollice Espy, MD;  Location: [facility] ORS;  Service: Urology;  Laterality: N/A;    Home Medications:  Allergies as of 07/09/2020      Reactions   Aspirin Rash   Can tolerate 81 mg   Cefaclor Rash   Keflex [cephalexin] Rash   Penicillins Rash   20 years      Medication List       Accurate as of  July 09, 2020 10:43 AM. If you have any questions, ask your nurse or doctor.        STOP taking these medications   ciprofloxacin 500 MG tablet Commonly known as: Cipro Stopped by: Hollice Espy, MD     TAKE these medications   acetaminophen 500 MG tablet Commonly known as: TYLENOL Take 1,000 mg by mouth every 6 (six) hours as needed for mild pain or moderate pain.   albuterol 108 (90 Base) MCG/ACT inhaler Commonly known as: VENTOLIN HFA Inhale 2 puffs into the lungs every 6 (six) hours as needed for wheezing or shortness of breath.   aspirin 81 MG tablet Take 81 mg by mouth daily.   clopidogrel 75 MG tablet Commonly known as: PLAVIX Take 75 mg by mouth daily.   CO-ENZYME Q-10 PO Take 200 mg by mouth daily.   ferrous sulfate 325 (65 FE) MG tablet Take 325 mg by mouth daily.   GLUCOSAMINE-CHONDROITIN PO Take 1,500 mg by mouth daily.   GUAIFENESIN ER PO Take 800 mg by mouth in the morning and at bedtime.   losartan 50 MG tablet Commonly known as: COZAAR Take 50 mg by mouth daily.   metoprolol succinate 25 MG 24 hr tablet Commonly known as: TOPROL-XL Take 50 mg by mouth daily.   montelukast 10 MG tablet Commonly known as: SINGULAIR Take 10 mg by mouth at bedtime.   nitroGLYCERIN 0.4 MG SL tablet Commonly known as: NITROSTAT Place 0.4 mg under the tongue every 5 (five) minutes as needed for chest pain.   Omega 3-6-9 Caps Take 1 capsule by mouth daily.   pantoprazole 40 MG tablet Commonly known as: PROTONIX Take 40 mg by mouth daily.   phenylephrine 10 MG Tabs tablet Commonly known as: SUDAFED PE Take 10 mg by mouth every 4 (four) hours as needed (facial pressure).   rosuvastatin 5 MG tablet Commonly known as: CRESTOR Take 5 mg by mouth daily.   tamsulosin 0.4 MG Caps capsule Commonly known as: Flomax Take 1 capsule (0.4 mg total) by mouth in the morning and at bedtime.   Vitamin D 50 MCG (2000 UT) Caps Take 2,000 Units by mouth daily.   zinc  gluconate 50 MG tablet Take 50 mg by mouth daily.       Allergies:  Allergies  Allergen Reactions  . Aspirin Rash    Can tolerate 81 mg  . Cefaclor Rash  . Keflex [Cephalexin] Rash  . Penicillins Rash  20 years    Family History: Family History  Problem Relation Age of Onset  . Heart disease Father   . Prostate cancer Brother   . Kidney cancer Neg Hx   . Bladder Cancer Neg Hx     Social History:  reports that he has never smoked. He has never used smokeless tobacco. He reports current alcohol use. He reports that he does not use drugs.   Physical Exam: BP 120/79   Pulse 93   Ht 6\' 2"  (1.88 m)   Wt (!) 252 lb (114.3 kg)   BMI 32.35 kg/m   Constitutional:  Alert and oriented, No acute distress. HEENT: [place] AT, moist mucus membranes.  Trachea midline, no masses. Cardiovascular: No clubbing, cyanosis, or edema. Respiratory: Normal respiratory effort, no increased work of breathing. Skin: No rashes, bruises or suspicious lesions. Neurologic: Grossly intact, no focal deficits, moving all 4 extremities. Psychiatric: Normal mood and affect.  Laboratory Data:  Lab Results  Component Value Date   CREATININE 0.80 06/03/2020     Pertinent Imaging: Results for orders placed or performed in visit on 07/09/20  BLADDER SCAN AMB NON-IMAGING  Result Value Ref Range   Scan Result 49ml      Assessment & Plan:   1. Prostate cancer [facility]) -S/p IMRT brachyboost in 6/21 with 6 months ADT. -PSA in 6 months  2. Urinary frequency  PVR is 24 mL. IPSS score is 24, severe Increased Flomax to BID per Dr. Baruch Gouty. -Samples for Myrbetriq 25 mg x 1 month given today to help with urgency/ frequency symptoms -Symptoms should improve with time with time, will titrate off meds as improves  F/u 22months with IPSS/ PVR/ PSA  [facility] [address], [place], [place] [address] [phone]  I, Selena Batten, am acting as a scribe for Dr.  Hollice Espy.  I have reviewed the above documentation for accuracy and completeness, and I agree with the above.   Hollice Espy, MD

## 2020-07-09 ENCOUNTER — Ambulatory Visit (INDEPENDENT_AMBULATORY_CARE_PROVIDER_SITE_OTHER): Payer: Medicare Other | Admitting: Urology

## 2020-07-09 ENCOUNTER — Encounter: Payer: Self-pay | Admitting: Urology

## 2020-07-09 ENCOUNTER — Other Ambulatory Visit: Payer: Self-pay

## 2020-07-09 VITALS — BP 120/79 | HR 93 | Ht 74.0 in | Wt 252.0 lb

## 2020-07-09 DIAGNOSIS — C61 Malignant neoplasm of prostate: Secondary | ICD-10-CM | POA: Diagnosis not present

## 2020-07-09 LAB — BLADDER SCAN AMB NON-IMAGING

## 2020-07-19 ENCOUNTER — Ambulatory Visit
Admission: RE | Admit: 2020-07-19 | Discharge: 2020-07-19 | Disposition: A | Discharge status: Home / Self Care | Payer: Medicare Other | Source: Ambulatory Visit | Attending: Radiation Oncology | Admitting: Radiation Oncology

## 2020-07-19 DIAGNOSIS — C61 Malignant neoplasm of prostate: Secondary | ICD-10-CM | POA: Insufficient documentation

## 2020-07-20 DIAGNOSIS — C61 Malignant neoplasm of prostate: Secondary | ICD-10-CM | POA: Diagnosis not present

## 2020-09-06 ENCOUNTER — Encounter: Payer: Self-pay | Admitting: *Deleted

## 2020-09-08 ENCOUNTER — Ambulatory Visit: Payer: Self-pay | Admitting: Urology

## 2020-09-30 ENCOUNTER — Other Ambulatory Visit: Payer: Self-pay | Admitting: *Deleted

## 2020-09-30 ENCOUNTER — Other Ambulatory Visit: Payer: Self-pay

## 2020-09-30 ENCOUNTER — Inpatient Hospital Stay: Payer: Medicare Other | Attending: Radiation Oncology

## 2020-09-30 DIAGNOSIS — C61 Malignant neoplasm of prostate: Secondary | ICD-10-CM | POA: Diagnosis present

## 2020-09-30 LAB — PSA: Prostatic Specific Antigen: <0.01 ng/mL (ref 0.00–4.00)

## 2020-10-07 ENCOUNTER — Ambulatory Visit: Payer: 59 | Admitting: Radiation Oncology

## 2020-10-11 ENCOUNTER — Other Ambulatory Visit: Payer: Self-pay

## 2020-10-11 ENCOUNTER — Ambulatory Visit
Admission: RE | Admit: 2020-10-11 | Discharge: 2020-10-11 | Disposition: A | Discharge status: Home / Self Care | Payer: Medicare Other | Source: Ambulatory Visit | Attending: Radiation Oncology | Admitting: Radiation Oncology

## 2020-10-11 ENCOUNTER — Other Ambulatory Visit: Payer: Self-pay | Admitting: *Deleted

## 2020-10-11 ENCOUNTER — Encounter: Payer: Self-pay | Admitting: Radiation Oncology

## 2020-10-11 VITALS — BP 136/83 | HR 79 | Temp 97.6°F | Resp 16 | Wt 259.9 lb

## 2020-10-11 DIAGNOSIS — Z923 Personal history of irradiation: Secondary | ICD-10-CM | POA: Diagnosis not present

## 2020-10-11 DIAGNOSIS — C61 Malignant neoplasm of prostate: Secondary | ICD-10-CM | POA: Insufficient documentation

## 2020-10-11 DIAGNOSIS — R350 Frequency of micturition: Secondary | ICD-10-CM

## 2020-10-11 DIAGNOSIS — R351 Nocturia: Secondary | ICD-10-CM | POA: Diagnosis not present

## 2020-10-11 NOTE — Progress Notes (Signed)
Radiation Oncology Follow up Note  Name: Kenneth Mayo   Date:   10/11/2020 MRN:  [id number] DOB: [birth year]    This 64 y.o. male presents to the clinic today for 1 month follow-up status post I-125 interstitial implant for Gleason 7 (3+4) adenocarcinoma the prostate presenting with a PSA of 7.4.  REFERRING PROVIDER: Tracie Harrier, MD  HPI: Patient is a 64 year old male now out 4 months having completed I-125 interstitial implant for Gleason 7 adenocarcinoma the prostate.  Seen today he is doing fairly well still has urgency and frequency and nocturia x4-5.  He has stopped taking his Flomax.  His most recent PSA is less than 0.01.Marland Kitchen  COMPLICATIONS OF TREATMENT: none  FOLLOW UP COMPLIANCE: keeps appointments   PHYSICAL EXAM:  BP 136/83 (BP Location: Left Arm, Patient Position: Sitting, Cuff Size: Normal)   Pulse 79   Temp 97.6 F (36.4 C) (Tympanic)   Resp 16   Wt 259 lb 14.4 oz (117.9 kg)   BMI 33.37 kg/m  Well-developed well-nourished patient in NAD. HEENT reveals PERLA, EOMI, discs not visualized.  Oral cavity is clear. No oral mucosal lesions are identified. Neck is clear without evidence of cervical or supraclavicular adenopathy. Lungs are clear to A&P. Cardiac examination is essentially unremarkable with regular rate and rhythm without murmur rub or thrill. Abdomen is benign with no organomegaly or masses noted. Motor sensory and DTR levels are equal and symmetric in the upper and lower extremities. Cranial nerves II through XII are grossly intact. Proprioception is intact. No peripheral adenopathy or edema is identified. No motor or sensory levels are noted. Crude visual fields are within normal range.  RADIOLOGY RESULTS: No current films to review  PLAN: Present time patient is under excellent biochemical control of his prostate cancer.  I am asked him to resume his Flomax and refilled his prescriptions.  I have told him there may be other problems especially related to his  bladder that if his urinary symptoms persist he is to contact us after a trial of Flomax for the next 2 to 3 weeks.  Otherwise have asked to see him back in 6 months for follow-up with a PSA.  Patient knows to call with any concerns.  I would like to take this opportunity to thank you for allowing me to participate in the care of your patient.Noreene Filbert, MD

## 2021-01-07 ENCOUNTER — Ambulatory Visit (INDEPENDENT_AMBULATORY_CARE_PROVIDER_SITE_OTHER): Payer: Medicare Other | Admitting: Urology

## 2021-01-07 ENCOUNTER — Encounter: Payer: Self-pay | Admitting: Urology

## 2021-01-07 ENCOUNTER — Other Ambulatory Visit: Payer: Self-pay

## 2021-01-07 VITALS — BP 158/80 | HR 89 | Ht 74.0 in | Wt 261.0 lb

## 2021-01-07 DIAGNOSIS — C61 Malignant neoplasm of prostate: Secondary | ICD-10-CM | POA: Diagnosis not present

## 2021-01-07 DIAGNOSIS — R3915 Urgency of urination: Secondary | ICD-10-CM

## 2021-01-07 DIAGNOSIS — N401 Enlarged prostate with lower urinary tract symptoms: Secondary | ICD-10-CM | POA: Diagnosis not present

## 2021-01-07 DIAGNOSIS — N5235 Erectile dysfunction following radiation therapy: Secondary | ICD-10-CM

## 2021-01-07 LAB — BLADDER SCAN AMB NON-IMAGING: Scan Result: 49

## 2021-01-07 NOTE — Progress Notes (Signed)
01/07/2021 9:53 AM   Kenneth Mayo 07/06/1956 [id number]  Referring provider: Tracie Harrier, MD [address] [facility] [place],  [place] [address]  No chief complaint on file.   HPI: 65 year old male with a personal history of prostate cancer returns today for follow-up.  He has a longstanding history of prostate cancer initially on active surveillance and then eventual upstaging of his malignancy to unfavorable intermediate risk.  He elected to undergo brachytherapy seed placement with ADT x6 months.  This was completed on 06/07/2020.  His PSA remained undetectable 3 months ago.  In terms of urinary symptoms, symptoms are poorly controlled.  He does have a personal history of prostamegaly with prostate volume in the 60s.  He has minimal obstructing voiding symptoms on Flomax.  He is primarily bothered by his irritative urinary symptoms including urinary frequency, urgency and nocturia x4-5.  This was present prior to radiation but seems to have worsened.  IPSS as below.  Hot flashes are waning.  He is also concerned today about worsening erectile dysfunction.  He reports significantly decreased libido, some fatigue, as well as difficulty maintaining and achieving erections.  He is interested in trying Viagra which he has never previously taken.  He does have a remote history of MI in 2010 and does hold a prescription for nitroglycerin but is never taken this.   IPSS    Row Name 01/07/21 0900         International Prostate Symptom Score   How often have you had the sensation of not emptying your bladder? Less than half the time     How often have you had to urinate less than every two hours? Almost always     How often have you found you stopped and started again several times when you urinated? Less than 1 in 5 times     How often have you found it difficult to postpone urination? More than half the time     How often have you had a weak urinary stream? Not  at All     How often have you had to strain to start urination? Not at All     How many times did you typically get up at night to urinate? 5 Times     Total IPSS Score 17           Quality of Life due to urinary symptoms   If you were to spend the rest of your life with your urinary condition just the way it is now how would you feel about that? Unhappy            Score:  1-7 Mild 8-19 Moderate 20-35 Severe    PMH: Past Medical History:  Diagnosis Date   Anemia    Arrhythmia    Asthma    CAD (coronary artery disease)    Cancer (HCC)    prostate cancer   Elevated PSA    GERD (gastroesophageal reflux disease)    Heart attack [facility])     Surgical History: Past Surgical History:  Procedure Laterality Date   CORONARY ANGIOPLASTY WITH STENT PLACEMENT  2010   CYSTOSCOPY  06/07/2020   Procedure: CYSTOSCOPY;  Surgeon: Hollice Espy, MD;  Location: [facility] ORS;  Service: Urology;;   [place] N/A 06/07/2020   Procedure: RADIOACTIVE SEED IMPLANT/BRACHYTHERAPY IMPLANT;  Surgeon: Hollice Espy, MD;  Location: [facility] ORS;  Service: Urology;  Laterality: N/A;    Home Medications:  Allergies as of 01/07/2021      Reactions   Aspirin Rash   Can tolerate 81 mg   Cefaclor Rash   Keflex [cephalexin] Rash   Penicillins Rash   20 years      Medication List       Accurate as of January 07, 2021  9:53 AM. If you have any questions, ask your nurse or doctor.        STOP taking these medications   clopidogrel 75 MG tablet Commonly known as: PLAVIX Stopped by: Hollice Espy, MD     TAKE these medications   acetaminophen 500 MG tablet Commonly known as: TYLENOL Take 1,000 mg by mouth every 6 (six) hours as needed for mild pain or moderate pain.   albuterol 108 (90 Base) MCG/ACT inhaler Commonly known as: VENTOLIN HFA Inhale 2 puffs into the lungs every 6 (six) hours as needed for wheezing or  shortness of breath.   aspirin 81 MG tablet Take 81 mg by mouth daily.   CO-ENZYME Q-10 PO Take 200 mg by mouth daily.   ferrous sulfate 325 (65 FE) MG tablet Take 325 mg by mouth daily.   GLUCOSAMINE-CHONDROITIN PO Take 1,500 mg by mouth daily.   GUAIFENESIN ER PO Take 800 mg by mouth in the morning and at bedtime.   losartan 100 MG tablet Commonly known as: COZAAR Take by mouth. What changed: Another medication with the same name was removed. Continue taking this medication, and follow the directions you see here. Changed by: Hollice Espy, MD   metoprolol succinate 25 MG 24 hr tablet Commonly known as: TOPROL-XL Take 50 mg by mouth daily.   montelukast 10 MG tablet Commonly known as: SINGULAIR Take 10 mg by mouth at bedtime.   nitroGLYCERIN 0.4 MG SL tablet Commonly known as: NITROSTAT Place 0.4 mg under the tongue every 5 (five) minutes as needed for chest pain.   Omega 3-6-9 Caps Take 1 capsule by mouth daily.   pantoprazole 40 MG tablet Commonly known as: PROTONIX Take 40 mg by mouth daily.   phenylephrine 10 MG Tabs tablet Commonly known as: SUDAFED PE Take 10 mg by mouth every 4 (four) hours as needed (facial pressure).   rosuvastatin 5 MG tablet Commonly known as: CRESTOR Take 5 mg by mouth daily.   tamsulosin 0.4 MG Caps capsule Commonly known as: Flomax Take 1 capsule (0.4 mg total) by mouth in the morning and at bedtime.   Vitamin D 50 MCG (2000 UT) Caps Take 2,000 Units by mouth daily.   zinc gluconate 50 MG tablet Take 50 mg by mouth daily.       Allergies:  Allergies  Allergen Reactions   Aspirin Rash    Can tolerate 81 mg   Cefaclor Rash   Keflex [Cephalexin] Rash   Penicillins Rash    20 years    Family History: Family History  Problem Relation Age of Onset   Heart disease Father    Prostate cancer Brother    Kidney cancer Neg Hx    Bladder Cancer Neg Hx     Social History:  reports that he has never smoked.  He has never used smokeless tobacco. He reports current alcohol use. He reports that he does not use drugs.   Physical Exam: BP (!) 158/80    Pulse 89    Ht 6\' 2"  (1.88 m)    Wt 261 lb (118.4 kg)    BMI 33.51  kg/m   Constitutional:  Alert and oriented, No acute distress.  Accompanied today by his wife who also has good questions. HEENT: [place] AT, moist mucus membranes.  Trachea midline, no masses. Cardiovascular: No clubbing, cyanosis, or edema. Respiratory: Normal respiratory effort, no increased work of breathing. Skin: No rashes, bruises or suspicious lesions. Neurologic: Grossly intact, no focal deficits, moving all 4 extremities. Psychiatric: Normal mood and affect.   Pertinent Imaging: Results for orders placed or performed in visit on 01/07/21  BLADDER SCAN AMB NON-IMAGING  Result Value Ref Range   Scan Result 49 ml      Assessment & Plan:    1. Prostate cancer ([place]) No evidence of disease  No need for further ADT, side effects from ADT should continue to weigh  Recheck PSA in 3 to 4 months, at least every 6 months for the first several years  - BLADDER SCAN AMB NON-IMAGING  2. Erectile dysfunction after prostate brachytherapy We discussed the pathophysiology of erectile dysfunction today along with possible contributing factors. Discussed possible treatment options including PDE 5 inhibitors.  In terms of PDE 5 inhibitors, we discussed contraindications for this medication as well as common side effects. Patient was counseled on optimal use. All of his questions were answered in detail.  He understands the risk of sildenafil with nitroglycerin.  He has never had to take this.  If he does experience chest pain, he will not take nitroglycerin as well as alert EMS/the emergency room physicians.  3. Benign prostatic hyperplasia (BPH) with urinary urgency/nocturia Increased bothersome irritative urinary symptoms  Continue Flomax  Lengthy discussion today regarding  management options.  He does have underlying BPH with prostamegaly and this may consider addressing his outlet which may be contributing to his irritative urinary symptoms.  As an alternative, recommend treating this OAB type symptoms with anticholinergics or beta 3 agonist.  He does not recall taking Myrbetriq in the past.  He was given samples again today Myrbetriq 25 mg for a month we will have him return in a month to reassess.  Plan to be more aggressive with managing the symptoms  Emptying today is adequate.   F/u 1 months for IPSS/ PVR   Hollice Espy, MD  [facility] [address] [place], [place] [address] [phone]

## 2021-01-14 ENCOUNTER — Ambulatory Visit: Payer: Medicare Other | Admitting: Urology

## 2021-02-04 ENCOUNTER — Telehealth: Payer: Self-pay | Admitting: Urology

## 2021-02-04 ENCOUNTER — Encounter: Payer: Self-pay | Admitting: Urology

## 2021-02-04 ENCOUNTER — Other Ambulatory Visit: Payer: Self-pay

## 2021-02-04 ENCOUNTER — Ambulatory Visit (INDEPENDENT_AMBULATORY_CARE_PROVIDER_SITE_OTHER): Payer: Medicare Other | Admitting: Urology

## 2021-02-04 VITALS — BP 130/81 | HR 85 | Ht 74.0 in | Wt 264.0 lb

## 2021-02-04 DIAGNOSIS — N401 Enlarged prostate with lower urinary tract symptoms: Secondary | ICD-10-CM | POA: Diagnosis not present

## 2021-02-04 DIAGNOSIS — C61 Malignant neoplasm of prostate: Secondary | ICD-10-CM | POA: Diagnosis not present

## 2021-02-04 DIAGNOSIS — N5235 Erectile dysfunction following radiation therapy: Secondary | ICD-10-CM

## 2021-02-04 DIAGNOSIS — R3915 Urgency of urination: Secondary | ICD-10-CM

## 2021-02-04 LAB — BLADDER SCAN AMB NON-IMAGING: Scan Result: 34

## 2021-02-04 MED ORDER — MIRABEGRON ER 50 MG PO TB24: 50.0000 mg | ORAL_TABLET | Freq: Every day | ORAL | 11 refills | Status: DC

## 2021-02-04 MED ORDER — SILDENAFIL CITRATE 20 MG PO TABS: 20.0000 mg | ORAL_TABLET | ORAL | 11 refills | Status: DC | PRN

## 2021-02-04 NOTE — Progress Notes (Signed)
02/04/2021 9:13 AM   Kenneth Mayo [birth year] [id number]  Referring provider: Tracie Harrier, MD [address] [facility] [place],  [place] [address]  Chief Complaint  Patient presents with  . Follow-up    82mth follow-up    HPI: 65 year old male with a personal history of prostate cancer status post seed implantation as well as irritative urinary symptoms and erectile dysfunction returns today for follow-up.  Since last visit, he added Myrbetriq 25 mg to his regimen which previously included Flomax.  He reports that since starting this medication, he only gets up once or twice at night to urinate, has more time to get to the bathroom, does not have as much urgency.  He has no side effects from this medication.  He is pleased with his urinary symptoms at this point in time.  He would like to continue this medication perhaps optimize it.  We also talked about erectile dysfunction last visit.  He never received his prescription and never tried it.  He is interested in pursuing this at this time.     IPSS    Row Name 02/04/21 0900         International Prostate Symptom Score   How often have you had the sensation of not emptying your bladder? Less than 1 in 5     How often have you had to urinate less than every two hours? Less than half the time     How often have you found you stopped and started again several times when you urinated? Less than 1 in 5 times     How often have you found it difficult to postpone urination? More than half the time     How often have you had a weak urinary stream? Less than 1 in 5 times     How often have you had to strain to start urination? Not at All     How many times did you typically get up at night to urinate? 2 Times     Total IPSS Score 11           Quality of Life due to urinary symptoms   If you were to spend the rest of your life with your urinary condition just the way it is now how would you feel about that? Mixed             Score:  1-7 Mild 8-19 Moderate 20-35 Severe   PMH: Past Medical History:  Diagnosis Date  . Anemia   . Arrhythmia   . Asthma   . CAD (coronary artery disease)   . Cancer [facility])    prostate cancer  . Elevated PSA   . GERD (gastroesophageal reflux disease)   . Heart attack [facility])     Surgical History: Past Surgical History:  Procedure Laterality Date  . CORONARY ANGIOPLASTY WITH STENT PLACEMENT  2010  . CYSTOSCOPY  06/07/2020   Procedure: CYSTOSCOPY;  Surgeon: Hollice Espy, MD;  Location: [facility] ORS;  Service: Urology;;  . KNEE SURGERY Right 1998  . NASAL SINUS SURGERY    . RADIOACTIVE SEED IMPLANT N/A 06/07/2020   Procedure: RADIOACTIVE SEED IMPLANT/BRACHYTHERAPY IMPLANT;  Surgeon: Hollice Espy, MD;  Location: [facility] ORS;  Service: Urology;  Laterality: N/A;    Home Medications:  Allergies as of 02/04/2021      Reactions   Aspirin Rash   Can tolerate 81 mg   Cefaclor Rash   Keflex [cephalexin] Rash   Penicillins Rash  20 years      Medication List       Accurate as of February 04, 2021  9:13 AM. If you have any questions, ask your nurse or doctor.        STOP taking these medications   montelukast 10 MG tablet Commonly known as: SINGULAIR Stopped by: Hollice Espy, MD   nitroGLYCERIN 0.4 MG SL tablet Commonly known as: NITROSTAT Stopped by: Hollice Espy, MD     TAKE these medications   acetaminophen 500 MG tablet Commonly known as: TYLENOL Take 1,000 mg by mouth every 6 (six) hours as needed for mild pain or moderate pain.   albuterol 108 (90 Base) MCG/ACT inhaler Commonly known as: VENTOLIN HFA Inhale 2 puffs into the lungs every 6 (six) hours as needed for wheezing or shortness of breath.   aspirin 81 MG tablet Take 81 mg by mouth daily.   CO-ENZYME Q-10 PO Take 200 mg by mouth daily.   ferrous sulfate 325 (65 FE) MG tablet Take 325 mg by mouth daily.   GLUCOSAMINE-CHONDROITIN PO Take 1,500 mg by mouth daily.    GUAIFENESIN ER PO Take 800 mg by mouth in the morning and at bedtime.   losartan 100 MG tablet Commonly known as: COZAAR Take by mouth.   metoprolol succinate 25 MG 24 hr tablet Commonly known as: TOPROL-XL Take 50 mg by mouth daily.   Omega 3-6-9 Caps Take 1 capsule by mouth daily.   pantoprazole 40 MG tablet Commonly known as: PROTONIX Take 40 mg by mouth daily.   phenylephrine 10 MG Tabs tablet Commonly known as: SUDAFED PE Take 10 mg by mouth every 4 (four) hours as needed (facial pressure).   rosuvastatin 5 MG tablet Commonly known as: CRESTOR Take 5 mg by mouth daily.   tamsulosin 0.4 MG Caps capsule Commonly known as: Flomax Take 1 capsule (0.4 mg total) by mouth in the morning and at bedtime.   Vitamin D 50 MCG (2000 UT) Caps Take 2,000 Units by mouth daily.   zinc gluconate 50 MG tablet Take 50 mg by mouth daily.       Allergies:  Allergies  Allergen Reactions  . Aspirin Rash    Can tolerate 81 mg  . Cefaclor Rash  . Keflex [Cephalexin] Rash  . Penicillins Rash    20 years    Family History: Family History  Problem Relation Age of Onset  . Heart disease Father   . Prostate cancer Brother   . Kidney cancer Neg Hx   . Bladder Cancer Neg Hx     Social History:  reports that he has never smoked. He has never used smokeless tobacco. He reports current alcohol use. He reports that he does not use drugs.   Physical Exam: BP 130/81   Pulse 85   Ht 6\' 2"  (1.88 m)   Wt 264 lb (119.7 kg)   BMI 33.90 kg/m   Constitutional:  Alert and oriented, No acute distress.  Accompanied by wife today HEENT: [place] AT, moist mucus membranes.  Trachea midline, no masses. Cardiovascular: No clubbing, cyanosis, or edema. Respiratory: Normal respiratory effort, no increased work of breathing. Skin: No rashes, bruises or suspicious lesions. Neurologic: Grossly intact, no focal deficits, moving all 4 extremities. Psychiatric: Normal mood and affect.    Results  for orders placed or performed in visit on 02/04/21  BLADDER SCAN AMB NON-IMAGING  Result Value Ref Range   Scan Result 34 ml      Assessment & Plan:  1. Benign prostatic hyperplasia (BPH) with urinary urgency Improving on Myrbetriq 25 mg with Flomax  We will try to push this dose to 50 mg to see if we can further optimize his urinary symptoms.  Continue Flomax.  We discussed that in the future, if he fails to respond or does not want take medications, may benefit from urodynamics prior to consideration of any outlet procedure.  He is agreeable this plan. - BLADDER SCAN AMB NON-IMAGING  2. Erectile dysfunction after prostate brachytherapy Sildenafil prescribed today, prescription sent to [facility] with good Rx prescription  We discussed precautions again especially cardiac and concomitant use with nitroglycerin.  3. Prostate cancer [facility]) We will check PSA in 3 months   F/u 3 mo with IPSS/ PVR/ PSA  Hollice Espy, MD  [facility] [address] [place], [place] [address] [phone]

## 2021-02-04 NOTE — Telephone Encounter (Signed)
I do not believe we have any coupons for Myrbetriq. We do have samples but obviously this is not sustainable for long-term. He can pick up a 50 mg box for the next month to try. Alternatively, we can try Gemtesa and see if that is more affordable.  Hollice Espy, MD

## 2021-02-04 NOTE — Telephone Encounter (Signed)
Patient advised-samples will be in our office up front. Voiced understanding.

## 2021-02-04 NOTE — Telephone Encounter (Signed)
Pt wife called stating she was advised to call back if pts increase in dosage of medication, didn't know the name of the medication not sure if it's flomax or Mybetriq.  The medication will cost $400/mo. Pt states he was told about a coupon and possible samples. Wife is on [place]. And can be reached @ [phone]. Please advise

## 2021-03-08 ENCOUNTER — Telehealth: Payer: Self-pay

## 2021-03-08 NOTE — Telephone Encounter (Signed)
Some people take Flomax if their symptoms are poorly controlled they feel like they need it.  When you increase Flomax to twice daily, sometimes he can have more side effects like dizziness or fatigue.  I would have him play around with the dosing and find out if he can really see the difference between once daily dosing versus twice a day.  We can prescribe him which ever depending on what he feels like he needs.  Hollice Espy, MD

## 2021-03-08 NOTE — Telephone Encounter (Signed)
Patient advised to decrease dosing to see how his symptoms improve. Would like some more samples of Myrbetriq or alternate insurance will not cover medications. Please advise

## 2021-03-08 NOTE — Telephone Encounter (Signed)
Pt LVM on triage line questioning if he should be taking tamsulosin once daily or twice. He states he has been taking twice daily for sometime however when he received most recent RX from mail order label stated once. It appears that the last RX is was sent in by Dr. Baruch Gouty. Please advise.

## 2021-03-09 NOTE — Telephone Encounter (Signed)
Please have him find out from his insurance company which medications for overactive bladder are covered.  Hollice Espy, MD

## 2021-03-09 NOTE — Telephone Encounter (Addendum)
Patient informed, voiced understanding.  °

## 2021-03-10 ENCOUNTER — Telehealth: Payer: Self-pay

## 2021-03-10 NOTE — Telephone Encounter (Signed)
Patient called stating myrbetriq is over 800 dollars but is helping. Wants to know if there is something else we can try instead. He wanted me to remind you he does have a history of a heart attack.

## 2021-03-10 NOTE — Telephone Encounter (Signed)
Please see phone message from yesterday.  Selena Batten called him and asked him to check with his insurance company to see was covered for OAB meds, preferred.  Did he do that?  Hollice Espy, MD

## 2021-03-16 NOTE — Telephone Encounter (Signed)
Contacted patient, informed of alternate medications to inquire about for his insurance. Will contact his insurance company and let us know once he runs out of samples.

## 2021-04-04 ENCOUNTER — Inpatient Hospital Stay: Payer: Medicare Other | Attending: Radiation Oncology

## 2021-04-04 ENCOUNTER — Other Ambulatory Visit: Payer: Self-pay

## 2021-04-04 DIAGNOSIS — C61 Malignant neoplasm of prostate: Secondary | ICD-10-CM | POA: Insufficient documentation

## 2021-04-04 LAB — PSA: Prostatic Specific Antigen: 0.07 ng/mL (ref 0.00–4.00)

## 2021-04-05 NOTE — Telephone Encounter (Signed)
Incoming call from pt who states that he contacted insurance and they will cover Myrbetriq if sent to [facility] Mail order. Patient requests 50mg  tablets.

## 2021-04-06 MED ORDER — OXYBUTYNIN CHLORIDE ER 15 MG PO TB24: 15.0000 mg | ORAL_TABLET | Freq: Every day | ORAL | 1 refills | Status: DC

## 2021-04-06 NOTE — Telephone Encounter (Signed)
Oxybutynin 15 mg XL   Hollice Espy, MD

## 2021-04-06 NOTE — Telephone Encounter (Signed)
Patient returned call-prefers to try one month trial Oxybutynin-insurance will cover at a much lower cost. Prefers to be sent to [facility]

## 2021-04-06 NOTE — Addendum Note (Signed)
Addended by: Verlene Mayer A on: 04/06/2021 02:07 PM   Modules accepted: Orders

## 2021-04-06 NOTE — Telephone Encounter (Signed)
RX sent to [facility]

## 2021-04-14 ENCOUNTER — Other Ambulatory Visit: Payer: Self-pay

## 2021-04-14 ENCOUNTER — Ambulatory Visit
Admission: RE | Admit: 2021-04-14 | Discharge: 2021-04-14 | Disposition: A | Discharge status: Home / Self Care | Payer: Medicare Other | Source: Ambulatory Visit | Attending: Radiation Oncology | Admitting: Radiation Oncology

## 2021-04-14 VITALS — BP 159/88 | HR 76 | Temp 97.2°F | Resp 16 | Wt 267.5 lb

## 2021-04-14 DIAGNOSIS — R3915 Urgency of urination: Secondary | ICD-10-CM | POA: Diagnosis not present

## 2021-04-14 DIAGNOSIS — C61 Malignant neoplasm of prostate: Secondary | ICD-10-CM | POA: Insufficient documentation

## 2021-04-14 DIAGNOSIS — Z923 Personal history of irradiation: Secondary | ICD-10-CM | POA: Diagnosis not present

## 2021-04-14 NOTE — Progress Notes (Signed)
Radiation Oncology Follow up Note  Name: Kenneth Mayo   Date:   04/14/2021 MRN:  [id number] DOB: [birth year]    This 65 y.o. male presents to the clinic today for 65-month follow-up status post I-125 interstitial implant for Gleason 7 (3+4) adenocarcinoma presenting with a PSA of 7.4.  REFERRING PROVIDER: Tracie Harrier, MD  HPI: Patient is a 65 year old male now out 10 months having completed I-125 interstitial implant for Gleason 7 adenocarcinoma the prostate.  Seen today in routine follow-up he is doing well.  He has had some erectile dysfunction which has been helped with Cialis.  He also does have some frequency and urgency of urination currently on Flomax as well as generic Myrbetriq.  Those symptoms have improved.  His most recent PSA is 0.07.Marland Kitchen  COMPLICATIONS OF TREATMENT: none  FOLLOW UP COMPLIANCE: keeps appointments   PHYSICAL EXAM:  BP (!) 159/88   Pulse 76   Temp (!) 97.2 F (36.2 C)   Resp 16   Wt 267 lb 8 oz (121.3 kg)   SpO2 98%   BMI 34.34 kg/m  Well-developed well-nourished patient in NAD. HEENT reveals PERLA, EOMI, discs not visualized.  Oral cavity is clear. No oral mucosal lesions are identified. Neck is clear without evidence of cervical or supraclavicular adenopathy. Lungs are clear to A&P. Cardiac examination is essentially unremarkable with regular rate and rhythm without murmur rub or thrill. Abdomen is benign with no organomegaly or masses noted. Motor sensory and DTR levels are equal and symmetric in the upper and lower extremities. Cranial nerves II through XII are grossly intact. Proprioception is intact. No peripheral adenopathy or edema is identified. No motor or sensory levels are noted. Crude visual fields are within normal range.  RADIOLOGY RESULTS: No current films to review  PLAN: Present time patient is under excellent biochemical control of his prostate cancer and pleased with his overall progress.  I have asked to see him back in 6 months for  follow-up and then will start once year follow-up visits.  He continues close follow-up care with Dr. Erlene Quan.  Patient knows to call with any concerns.  I would like to take this opportunity to thank you for allowing me to participate in the care of your patient.Noreene Filbert, MD

## 2021-05-13 ENCOUNTER — Ambulatory Visit (INDEPENDENT_AMBULATORY_CARE_PROVIDER_SITE_OTHER): Payer: Medicare Other | Admitting: Urology

## 2021-05-13 ENCOUNTER — Encounter: Payer: Self-pay | Admitting: Urology

## 2021-05-13 ENCOUNTER — Other Ambulatory Visit: Payer: Self-pay

## 2021-05-13 VITALS — BP 151/69 | HR 83 | Ht 74.0 in | Wt 255.0 lb

## 2021-05-13 DIAGNOSIS — N401 Enlarged prostate with lower urinary tract symptoms: Secondary | ICD-10-CM

## 2021-05-13 DIAGNOSIS — C61 Malignant neoplasm of prostate: Secondary | ICD-10-CM | POA: Diagnosis not present

## 2021-05-13 DIAGNOSIS — R3915 Urgency of urination: Secondary | ICD-10-CM

## 2021-05-13 LAB — BLADDER SCAN AMB NON-IMAGING

## 2021-05-13 NOTE — Progress Notes (Signed)
05/13/2021 12:44 PM   Kenneth Mayo 1956/06/27 [id number]  Referring provider: Tracie Harrier, MD [address] [facility] [place],   [address]  Chief Complaint  Patient presents with  . Prostate Cancer    62mo w/PSA    HPI: 65 year old male with a personal history of prostate cancer status post brachytherapy who returns today for further discussion of his voiding symptoms.  Post procedurally, he was started on Flomax but continued to have primarily storage related symptoms including frequency urgency and nocturia.  He is on Myrbetriq 25 mg for quite some time but this became prohibitively expensive.  More recently, this was changed to oxybutynin 15 mg XL.  He reports that this also works but not quite as effectively.  He is now only getting up twice a night to void.  He feels like he could hold and not having accidents.  He is overall satisfied with this regimen.  No side effects from this medication.  He was recently seen by Dr. Baruch Gouty and was noted to have a very low PSA, 0.07 on 04/04/2021.   IPSS    Row Name 05/13/21 0900         International Prostate Symptom Score   How often have you had the sensation of not emptying your bladder? Not at All     How often have you had to urinate less than every two hours? About half the time     How often have you found you stopped and started again several times when you urinated? Less than 1 in 5 times     How often have you found it difficult to postpone urination? Not at All     How often have you had a weak urinary stream? Not at All     How often have you had to strain to start urination? Not at All     How many times did you typically get up at night to urinate? 2 Times     Total IPSS Score 6           Quality of Life due to urinary symptoms   If you were to spend the rest of your life with your urinary condition just the way it is now how would you feel about that? Mixed            Score:  1-7  Mild 8-19 Moderate 20-35 Severe    PMH: Past Medical History:  Diagnosis Date  . Anemia   . Arrhythmia   . Asthma   . CAD (coronary artery disease)   . Cancer [facility])    prostate cancer  . Elevated PSA   . GERD (gastroesophageal reflux disease)   . Heart attack [facility])     Surgical History: Past Surgical History:  Procedure Laterality Date  . CORONARY ANGIOPLASTY WITH STENT PLACEMENT  2010  . CYSTOSCOPY  06/07/2020   Procedure: CYSTOSCOPY;  Surgeon: Hollice Espy, MD;  Location: [facility] ORS;  Service: Urology;;  . KNEE SURGERY Right 1998  . NASAL SINUS SURGERY    . RADIOACTIVE SEED IMPLANT N/A 06/07/2020   Procedure: RADIOACTIVE SEED IMPLANT/BRACHYTHERAPY IMPLANT;  Surgeon: Hollice Espy, MD;  Location: [facility] ORS;  Service: Urology;  Laterality: N/A;    Home Medications:  Allergies as of 05/13/2021      Reactions   Aspirin Rash   Can tolerate 81 mg   Cefaclor Rash   Keflex [cephalexin] Rash   Penicillins Rash   20 years  Medication List       Accurate as of May 13, 2021 12:44 PM. If you have any questions, ask your nurse or doctor.        acetaminophen 500 MG tablet Commonly known as: TYLENOL Take 1,000 mg by mouth every 6 (six) hours as needed for mild pain or moderate pain.   albuterol 108 (90 Base) MCG/ACT inhaler Commonly known as: VENTOLIN HFA Inhale 2 puffs into the lungs every 6 (six) hours as needed for wheezing or shortness of breath.   aspirin 81 MG tablet Take 81 mg by mouth daily.   CO-ENZYME Q-10 PO Take 200 mg by mouth daily.   ferrous sulfate 325 (65 FE) MG tablet Take 325 mg by mouth daily.   GLUCOSAMINE-CHONDROITIN PO Take 1,500 mg by mouth daily.   GUAIFENESIN ER PO Take 800 mg by mouth in the morning and at bedtime.   losartan 100 MG tablet Commonly known as: COZAAR Take by mouth.   metoprolol succinate 25 MG 24 hr tablet Commonly known as: TOPROL-XL Take 50 mg by mouth daily.   montelukast 10 MG tablet Commonly known  as: SINGULAIR Take 10 mg by mouth at bedtime.   Omega 3-6-9 Caps Take 1 capsule by mouth daily.   oxybutynin 15 MG 24 hr tablet Commonly known as: DITROPAN XL Take 1 tablet (15 mg total) by mouth daily.   pantoprazole 40 MG tablet Commonly known as: PROTONIX Take 40 mg by mouth daily.   phenylephrine 10 MG Tabs tablet Commonly known as: SUDAFED PE Take 10 mg by mouth every 4 (four) hours as needed (facial pressure).   rosuvastatin 5 MG tablet Commonly known as: CRESTOR Take 5 mg by mouth daily.   sildenafil 20 MG tablet Commonly known as: Revatio Take 1 tablet (20 mg total) by mouth as needed. Take 1-5 tabs as needed prior to intercourse   tamsulosin 0.4 MG Caps capsule Commonly known as: Flomax Take 1 capsule (0.4 mg total) by mouth in the morning and at bedtime.   Vitamin D 50 MCG (2000 UT) Caps Take 2,000 Units by mouth daily.   zinc gluconate 50 MG tablet Take 50 mg by mouth daily.       Allergies:  Allergies  Allergen Reactions  . Aspirin Rash    Can tolerate 81 mg  . Cefaclor Rash  . Keflex [Cephalexin] Rash  . Penicillins Rash    20 years    Family History: Family History  Problem Relation Age of Onset  . Heart disease Father   . Prostate cancer Brother   . Kidney cancer Neg Hx   . Bladder Cancer Neg Hx     Social History:  reports that he has never smoked. He has never used smokeless tobacco. He reports current alcohol use. He reports that he does not use drugs.   Physical Exam: BP (!) 151/69   Pulse 83   Ht 6\' 2"  (1.88 m)   Wt 255 lb (115.7 kg)   BMI 32.74 kg/m   Constitutional:  Alert and oriented, No acute distress. HEENT: [place] AT, moist mucus membranes.  Trachea midline, no masses. Cardiovascular: No clubbing, cyanosis, or edema. Respiratory: Normal respiratory effort, no increased work of breathing.. Skin: No rashes, bruises or suspicious lesions. Neurologic: Grossly intact, no focal deficits, moving all 4 extremities. Psychiatric:  Normal mood and affect.   Results for orders placed or performed in visit on 05/13/21  BLADDER SCAN AMB NON-IMAGING  Result Value Ref Range   Scan Result  6ml      Assessment & Plan:    1. Benign prostatic hyperplasia (BPH) with urinary urgency Currently on Flomax and oxybutynin 15 mg XL with fairly good control of his urinary symptoms  We discussed that over time, symptoms may wane and he may no longer need either of these medications.  They can to stay on it for a few more months and then try to wean himself off the the Flomax.  If he does not notice any difference, they can do the same with oxybutynin.  If his symptoms recur, he can go back on these medications.    Adequate bladder emptying today which is reassuring  Will continue on the above regimen as discussed - BLADDER SCAN AMB NON-IMAGING  2. Prostate cancer ([place]) PSA low, will recheck in 1 year  No evidence of disease currently - PSA; Future   Return in about 1 year (around 05/13/2022) for 1year w/IPSS & PSA prior.  Hollice Espy, MD  [facility] [address] [place], [place] [address] [phone]

## 2021-06-03 ENCOUNTER — Encounter: Payer: Self-pay | Admitting: Podiatry

## 2021-09-19 ENCOUNTER — Other Ambulatory Visit: Payer: Self-pay | Admitting: Urology

## 2021-10-05 ENCOUNTER — Inpatient Hospital Stay: Payer: Medicare Other | Attending: Radiation Oncology

## 2021-10-05 ENCOUNTER — Other Ambulatory Visit: Payer: Self-pay

## 2021-10-05 ENCOUNTER — Other Ambulatory Visit: Payer: Self-pay | Admitting: *Deleted

## 2021-10-05 DIAGNOSIS — C61 Malignant neoplasm of prostate: Secondary | ICD-10-CM | POA: Insufficient documentation

## 2021-10-05 LAB — PSA: Prostatic Specific Antigen: 0.31 ng/mL (ref 0.00–4.00)

## 2021-10-12 ENCOUNTER — Other Ambulatory Visit: Payer: Self-pay | Admitting: *Deleted

## 2021-10-12 ENCOUNTER — Encounter: Payer: Self-pay | Admitting: Radiation Oncology

## 2021-10-12 ENCOUNTER — Other Ambulatory Visit: Payer: Self-pay

## 2021-10-12 ENCOUNTER — Ambulatory Visit
Admission: RE | Admit: 2021-10-12 | Discharge: 2021-10-12 | Disposition: A | Discharge status: Home / Self Care | Payer: Medicare Other | Source: Ambulatory Visit | Attending: Radiation Oncology | Admitting: Radiation Oncology

## 2021-10-12 DIAGNOSIS — Z923 Personal history of irradiation: Secondary | ICD-10-CM | POA: Insufficient documentation

## 2021-10-12 DIAGNOSIS — C61 Malignant neoplasm of prostate: Secondary | ICD-10-CM | POA: Diagnosis present

## 2021-10-12 NOTE — Progress Notes (Signed)
Radiation Oncology Follow up Note  Name: Kenneth Mayo   Date:   10/12/2021 MRN:  [id number] DOB: Oct 26, 1956    This 65 y.o. male presents to the clinic today for 46-month follow-up status post I-125 interstitial implant for Gleason 7 (3+4) adenocarcinoma presenting with a PSA of 7.4.  REFERRING PROVIDER: Tracie Harrier, MD  HPI: Patient is a 65 year old male now out 16 months having completed I-125 interstitial implant for stage IIa adenocarcinoma the prostate.  He is seen today in routine follow-up clinically is doing well specifically denies any increased lower urinary tract symptoms diarrhea or fatigue.Marland Kitchen  His PSA has ticked up slightly from 6 months ago from 0.07 2.31.  COMPLICATIONS OF TREATMENT: none  FOLLOW UP COMPLIANCE: keeps appointments   PHYSICAL EXAM:  BP (P) 139/85 (BP Location: Left Arm, Patient Position: Sitting)   Pulse (P) 72   Temp (!) (P) 97.3 F (36.3 C) (Tympanic)   Resp (P) 16   Wt (P) 239 lb 1.6 oz (108.5 kg)   BMI (P) 30.70 kg/m  Well-developed well-nourished patient in NAD. HEENT reveals PERLA, EOMI, discs not visualized.  Oral cavity is clear. No oral mucosal lesions are identified. Neck is clear without evidence of cervical or supraclavicular adenopathy. Lungs are clear to A&P. Cardiac examination is essentially unremarkable with regular rate and rhythm without murmur rub or thrill. Abdomen is benign with no organomegaly or masses noted. Motor sensory and DTR levels are equal and symmetric in the upper and lower extremities. Cranial nerves II through XII are grossly intact. Proprioception is intact. No peripheral adenopathy or edema is identified. No motor or sensory levels are noted. Crude visual fields are within normal range.  RADIOLOGY RESULTS: No current films for review  PLAN: Present time patient I have asked to see back in 3 months.  Certainly can be up tics over the first couple of years after I-125 interstitial implant.  If this turns to be a trend  will refer back to urology and possible medical oncology for opinion.  He may benefit from ADT therapy should he see a rising PSA consistently.  Patient comprehends my recommendations well.  He already has follow-up appointments with urology.  I would like to take this opportunity to thank you for allowing me to participate in the care of your patient.Noreene Filbert, MD

## 2022-01-19 ENCOUNTER — Inpatient Hospital Stay: Payer: Medicare Other | Attending: Radiation Oncology

## 2022-01-19 DIAGNOSIS — C61 Malignant neoplasm of prostate: Secondary | ICD-10-CM | POA: Insufficient documentation

## 2022-01-20 ENCOUNTER — Other Ambulatory Visit: Payer: Self-pay | Admitting: *Deleted

## 2022-01-23 ENCOUNTER — Inpatient Hospital Stay: Payer: Medicare Other

## 2022-01-23 ENCOUNTER — Other Ambulatory Visit: Payer: Self-pay

## 2022-01-23 DIAGNOSIS — C61 Malignant neoplasm of prostate: Secondary | ICD-10-CM | POA: Diagnosis present

## 2022-01-23 LAB — PSA: Prostatic Specific Antigen: 0.46 ng/mL (ref 0.00–4.00)

## 2022-01-26 ENCOUNTER — Ambulatory Visit
Admission: RE | Admit: 2022-01-26 | Discharge: 2022-01-26 | Disposition: A | Discharge status: Home / Self Care | Payer: Medicare Other | Source: Ambulatory Visit | Attending: Radiation Oncology | Admitting: Radiation Oncology

## 2022-01-26 ENCOUNTER — Other Ambulatory Visit: Payer: Self-pay

## 2022-01-26 ENCOUNTER — Encounter: Payer: Self-pay | Admitting: Radiation Oncology

## 2022-01-26 ENCOUNTER — Other Ambulatory Visit: Payer: Self-pay | Admitting: *Deleted

## 2022-01-26 VITALS — BP 139/89 | HR 72 | Temp 96.3°F | Resp 16 | Wt 230.3 lb

## 2022-01-26 DIAGNOSIS — Z923 Personal history of irradiation: Secondary | ICD-10-CM | POA: Diagnosis not present

## 2022-01-26 DIAGNOSIS — C61 Malignant neoplasm of prostate: Secondary | ICD-10-CM | POA: Insufficient documentation

## 2022-01-26 NOTE — Progress Notes (Signed)
Radiation Oncology Follow up Note  Name: Kenneth Mayo   Date:   01/26/2022 MRN:  [id number] DOB: 1956-02-28    This 66 y.o. male presents to the clinic today for 76-month follow-up status post I-125 interstitial implant for Gleason 7 (3+3 adenocarcinoma presenting with a PSA of 7.4..  REFERRING PROVIDER: Tracie Harrier, MD  HPI: Patient is a 66 year old male now about 19 months having completed I-125 interstitial implant for Gleason 7 adenocarcinoma.  Seen today in routine follow-up he is doing well specifically denies any increased lower urinary tract symptoms diarrhea or fatigue.Marland Kitchen  His PSA is stable from 3 months ago at 0.46.  He started having some slight uptake in his PSA 9 months ago and was 0.313 months prior.  COMPLICATIONS OF TREATMENT: none  FOLLOW UP COMPLIANCE: keeps appointments   PHYSICAL EXAM:  BP 139/89 (BP Location: Left Arm)    Pulse 72    Temp (!) 96.3 F (35.7 C)    Resp 16    Wt 230 lb 4.8 oz (104.5 kg)    BMI 29.57 kg/m  Well-developed well-nourished patient in NAD. HEENT reveals PERLA, EOMI, discs not visualized.  Oral cavity is clear. No oral mucosal lesions are identified. Neck is clear without evidence of cervical or supraclavicular adenopathy. Lungs are clear to A&P. Cardiac examination is essentially unremarkable with regular rate and rhythm without murmur rub or thrill. Abdomen is benign with no organomegaly or masses noted. Motor sensory and DTR levels are equal and symmetric in the upper and lower extremities. Cranial nerves II through XII are grossly intact. Proprioception is intact. No peripheral adenopathy or edema is identified. No motor or sensory levels are noted. Crude visual fields are within normal range.  RADIOLOGY RESULTS: No current films for review  PLAN: At this time I am going to repeat his PSA in 6 months should we see continued elevation of his PSA will for to to oncology for management.  Patient may benefit at that time from pulse ADT therapy.   Patient comprehends her recommendations well.  He will have a PSA and follow-up in 6 months.  He continues follow-up care through urology.  I would like to take this opportunity to thank you for allowing me to participate in the care of your patient.Noreene Filbert, MD

## 2022-05-03 ENCOUNTER — Other Ambulatory Visit
Admission: RE | Admit: 2022-05-03 | Discharge: 2022-05-03 | Disposition: A | Discharge status: Home / Self Care | Payer: Medicare Other | Attending: Urology | Admitting: Urology

## 2022-05-03 DIAGNOSIS — C61 Malignant neoplasm of prostate: Secondary | ICD-10-CM | POA: Insufficient documentation

## 2022-05-03 LAB — PSA: Prostatic Specific Antigen: 0.36 ng/mL (ref 0.00–4.00)

## 2022-05-05 ENCOUNTER — Ambulatory Visit (INDEPENDENT_AMBULATORY_CARE_PROVIDER_SITE_OTHER): Payer: Medicare Other | Admitting: Urology

## 2022-05-05 VITALS — BP 157/92 | HR 68 | Ht 74.0 in | Wt 231.0 lb

## 2022-05-05 DIAGNOSIS — Z8546 Personal history of malignant neoplasm of prostate: Secondary | ICD-10-CM

## 2022-05-05 DIAGNOSIS — R350 Frequency of micturition: Secondary | ICD-10-CM

## 2022-05-05 DIAGNOSIS — N401 Enlarged prostate with lower urinary tract symptoms: Secondary | ICD-10-CM | POA: Diagnosis not present

## 2022-05-05 DIAGNOSIS — R3915 Urgency of urination: Secondary | ICD-10-CM

## 2022-05-05 DIAGNOSIS — C61 Malignant neoplasm of prostate: Secondary | ICD-10-CM

## 2022-05-05 MED ORDER — TAMSULOSIN HCL 0.4 MG PO CAPS: 0.4000 mg | ORAL_CAPSULE | Freq: Every day | ORAL | 3 refills | Status: AC

## 2022-05-05 MED ORDER — OXYBUTYNIN CHLORIDE ER 15 MG PO TB24: 15.0000 mg | ORAL_TABLET | Freq: Every day | ORAL | 3 refills | Status: AC

## 2022-05-05 NOTE — Progress Notes (Signed)
05/05/22 10:16 AM   Glyn Ade [birth year] [id number]  Referring provider:  Tracie Harrier, MD [address] [facility] [place],  [place] [address] Chief Complaint  Patient presents with   Benign Prostatic Hypertrophy    HPI: Kenneth Mayo is a 66 y.o. with a personal history of prostate cancer status post brachytherapy and BPH with urinary urgency who returns today for a 1 year follow-up with IPSS and PSA.   He is managed on Flomax and oxybutynin. IPSS 10 as below.   He is still taking flomax he reports that he wakes up x2 nightly. His symptoms of urinary urgency was not as bad before his prostate cancer treatment.  He reports that he is try to wean himself off the medication, is down to 1 Flomax daily but feels like he still needs this.  He also has tried to stop oxybutynin unsuccessfully had a resume.  He is doing fine with these 2 medications and has no side effects.    IPSS     Row Name 05/05/22 0900         International Prostate Symptom Score   How often have you had the sensation of not emptying your bladder? Not at All     How often have you had to urinate less than every two hours? About half the time     How often have you found you stopped and started again several times when you urinated? Less than 1 in 5 times     How often have you found it difficult to postpone urination? More than half the time     How often have you had a weak urinary stream? Not at All     How often have you had to strain to start urination? Not at All     How many times did you typically get up at night to urinate? 2 Times     Total IPSS Score 10       Quality of Life due to urinary symptoms   If you were to spend the rest of your life with your urinary condition just the way it is now how would you feel about that? Mixed              Score:  1-7 Mild 8-19 Moderate 20-35 Severe     PMH: Past Medical History:  Diagnosis Date   Anemia    Arrhythmia     Asthma    CAD (coronary artery disease)    Cancer (HCC)    prostate cancer   Elevated PSA    GERD (gastroesophageal reflux disease)    Heart attack [facility])     Surgical History: Past Surgical History:  Procedure Laterality Date   CORONARY ANGIOPLASTY WITH STENT PLACEMENT  2010   CYSTOSCOPY  06/07/2020   Procedure: CYSTOSCOPY;  Surgeon: Hollice Espy, MD;  Location: [facility] ORS;  Service: Urology;;   [place] N/A 06/07/2020   Procedure: RADIOACTIVE SEED IMPLANT/BRACHYTHERAPY IMPLANT;  Surgeon: Hollice Espy, MD;  Location: [facility] ORS;  Service: Urology;  Laterality: N/A;    Home Medications:  Allergies as of 05/05/2022       Reactions   Aspirin Rash   Can tolerate 81 mg   Cefaclor Rash   Keflex [cephalexin] Rash   Penicillins Rash   20 years        Medication List  Accurate as of May 05, 2022 10:16 AM. If you have any questions, ask your nurse or doctor.          acetaminophen 500 MG tablet Commonly known as: TYLENOL Take 1,000 mg by mouth every 6 (six) hours as needed for mild pain or moderate pain.   albuterol 108 (90 Base) MCG/ACT inhaler Commonly known as: VENTOLIN HFA Inhale 2 puffs into the lungs every 6 (six) hours as needed for wheezing or shortness of breath.   aspirin 81 MG tablet Take 81 mg by mouth daily.   CO-ENZYME Q-10 PO Take 200 mg by mouth daily.   ferrous sulfate 325 (65 FE) MG tablet Take 325 mg by mouth daily.   GLUCOSAMINE-CHONDROITIN PO Take 1,500 mg by mouth daily.   GUAIFENESIN ER PO Take 800 mg by mouth in the morning and at bedtime.   losartan 100 MG tablet Commonly known as: COZAAR Take by mouth.   metoprolol succinate 25 MG 24 hr tablet Commonly known as: TOPROL-XL Take 50 mg by mouth daily.   montelukast 10 MG tablet Commonly known as: SINGULAIR Take 10 mg by mouth at bedtime.   nitroGLYCERIN 0.4 MG SL tablet Commonly known as:  NITROSTAT Place under the tongue.   Omega 3-6-9 Caps Take 1 capsule by mouth daily.   oxybutynin 15 MG 24 hr tablet Commonly known as: DITROPAN XL TAKE 1 TABLET DAILY   pantoprazole 40 MG tablet Commonly known as: PROTONIX Take 40 mg by mouth daily.   phenylephrine 10 MG Tabs tablet Commonly known as: SUDAFED PE Take 10 mg by mouth every 4 (four) hours as needed (facial pressure).   rosuvastatin 5 MG tablet Commonly known as: CRESTOR Take 5 mg by mouth daily.   sildenafil 20 MG tablet Commonly known as: Revatio Take 1 tablet (20 mg total) by mouth as needed. Take 1-5 tabs as needed prior to intercourse   tamsulosin 0.4 MG Caps capsule Commonly known as: Flomax Take 1 capsule (0.4 mg total) by mouth in the morning and at bedtime.   Vitamin D 50 MCG (2000 UT) Caps Take 2,000 Units by mouth daily.   zinc gluconate 50 MG tablet Take 50 mg by mouth daily.        Allergies:  Allergies  Allergen Reactions   Aspirin Rash    Can tolerate 81 mg   Cefaclor Rash   Keflex [Cephalexin] Rash   Penicillins Rash    20 years    Family History: Family History  Problem Relation Age of Onset   Heart disease Father    Prostate cancer Brother    Kidney cancer Neg Hx    Bladder Cancer Neg Hx     Social History:  reports that he has never smoked. He has never used smokeless tobacco. He reports current alcohol use. He reports that he does not use drugs.   Physical Exam: BP (!) 157/92   Pulse 68   Ht '6\' 2"'$  (1.88 m)   Wt 231 lb (104.8 kg)   BMI 29.66 kg/m   Constitutional:  Alert and oriented, No acute distress. HEENT: [place] AT, moist mucus membranes.  Trachea midline, no masses. Cardiovascular: No clubbing, cyanosis, or edema. Respiratory: Normal respiratory effort, no increased work of breathing. Skin: No rashes, bruises or suspicious lesions. Neurologic: Grossly intact, no focal deficits, moving all 4 extremities. Psychiatric: Normal mood and affect.  Laboratory  Data:  Lab Results  Component Value Date   CREATININE 0.80 06/03/2020    Assessment & Plan:  1. Benign prostatic hyperplasia (BPH) with urinary urgency - He has been trying to ween off of Flomax and oxybutynin he still takes it and without them he has increased urinary symptoms.  - He still has urinary urgency and nocturia  - Continue Flomax and oxybutynin; refill sent   2. Prostate cancer ([place]) -PSA low, will recheck in 6 months  - No evidence of disease currently - PSA; Future  Follow-up PSA only in 6 months, PSA/IPSS with MD in a year  I,Kailey Littlejohn,acting as a scribe for Hollice Espy, MD.,have documented all relevant documentation on the behalf of Hollice Espy, MD,as directed by  Hollice Espy, MD while in the presence of Hollice Espy, MD.  I have reviewed the above documentation for accuracy and completeness, and I agree with the above.   Hollice Espy, MD   [facility] [address], [place], [place] [address] [phone]

## 2022-05-19 ENCOUNTER — Ambulatory Visit: Payer: Medicare Other | Admitting: Urology

## 2022-07-14 ENCOUNTER — Other Ambulatory Visit: Payer: Self-pay | Admitting: *Deleted

## 2022-07-19 ENCOUNTER — Inpatient Hospital Stay: Payer: Medicare Other | Attending: Radiation Oncology

## 2022-07-19 DIAGNOSIS — C61 Malignant neoplasm of prostate: Secondary | ICD-10-CM | POA: Insufficient documentation

## 2022-07-19 LAB — PSA: Prostatic Specific Antigen: 0.38 ng/mL (ref 0.00–4.00)

## 2022-07-26 ENCOUNTER — Ambulatory Visit
Admission: RE | Admit: 2022-07-26 | Discharge: 2022-07-26 | Disposition: A | Discharge status: Home / Self Care | Payer: Medicare Other | Source: Ambulatory Visit | Attending: Radiation Oncology | Admitting: Radiation Oncology

## 2022-07-26 ENCOUNTER — Encounter: Payer: Self-pay | Admitting: Radiation Oncology

## 2022-07-26 VITALS — BP 147/93 | HR 72 | Temp 96.4°F | Resp 18 | Ht 74.0 in | Wt 233.2 lb

## 2022-07-26 DIAGNOSIS — Z923 Personal history of irradiation: Secondary | ICD-10-CM | POA: Diagnosis not present

## 2022-07-26 DIAGNOSIS — R197 Diarrhea, unspecified: Secondary | ICD-10-CM | POA: Diagnosis not present

## 2022-07-26 DIAGNOSIS — C61 Malignant neoplasm of prostate: Secondary | ICD-10-CM

## 2022-07-26 DIAGNOSIS — R5383 Other fatigue: Secondary | ICD-10-CM | POA: Diagnosis not present

## 2022-07-26 NOTE — Progress Notes (Signed)
Radiation Oncology Follow up Note  Name: Kenneth Mayo   Date:   07/26/2022 MRN:  [id number] DOB: Oct 10, 1956    This 66 y.o. male presents to the clinic today for 2-year follow-up status post I-125 interstitial implant for Gleason 7 adenocarcinoma the prostate presenting with a PSA of 7.4.  REFERRING PROVIDER: Tracie Harrier, MD  HPI: Patient is a 66 year old male now out 2 years having completed I-125 interstitial implant for Gleason 7 adenocarcinoma the prostate presenting with initial PSA of 7.4.  Seen today in routine follow-up he is doing well.  He specifically denies any increased lower urinary tract symptoms diarrhea or fatigue.  His most recent PSA is absolutely stable at 2.99.  COMPLICATIONS OF TREATMENT: none  FOLLOW UP COMPLIANCE: keeps appointments   PHYSICAL EXAM:  BP (!) 147/93   Pulse 72   Temp (!) 96.4 F (35.8 C)   Resp 18   Ht '6\' 2"'$  (1.88 m)   Wt 233 lb 3.2 oz (105.8 kg)   BMI 29.94 kg/m  Well-developed well-nourished patient in NAD. HEENT reveals PERLA, EOMI, discs not visualized.  Oral cavity is clear. No oral mucosal lesions are identified. Neck is clear without evidence of cervical or supraclavicular adenopathy. Lungs are clear to A&P. Cardiac examination is essentially unremarkable with regular rate and rhythm without murmur rub or thrill. Abdomen is benign with no organomegaly or masses noted. Motor sensory and DTR levels are equal and symmetric in the upper and lower extremities. Cranial nerves II through XII are grossly intact. Proprioception is intact. No peripheral adenopathy or edema is identified. No motor or sensory levels are noted. Crude visual fields are within normal range.  RADIOLOGY RESULTS: No current films for review  PLAN: Present time patient is doing well under excellent biochemical control of his prostate cancer.  I will see him back in 1 year for follow-up with a repeat PSA.  Patient knows to call with any concerns.  I would like to take  this opportunity to thank you for allowing me to participate in the care of your patient.Noreene Filbert, MD

## 2022-09-07 ENCOUNTER — Other Ambulatory Visit: Payer: Self-pay | Admitting: Urology

## 2022-12-13 ENCOUNTER — Telehealth: Payer: Self-pay | Admitting: Urology

## 2022-12-13 NOTE — Telephone Encounter (Signed)
Pt has been selected to be in the [employer] by 12/28/22.  He would like a letter to excuse him from [employer].  Could you please write him the letter so he would have it before 12/28/22?  [phone].

## 2022-12-14 NOTE — Telephone Encounter (Signed)
Do not feel that this is medically appropriate.   .   Notified patient as instructed.

## 2023-04-24 ENCOUNTER — Other Ambulatory Visit
Admission: RE | Admit: 2023-04-24 | Discharge: 2023-04-24 | Disposition: A | Discharge status: Home / Self Care | Payer: Medicare Other | Attending: Urology | Admitting: Urology

## 2023-04-24 DIAGNOSIS — C61 Malignant neoplasm of prostate: Secondary | ICD-10-CM | POA: Insufficient documentation

## 2023-04-24 LAB — PSA: Prostatic Specific Antigen: 0.29 ng/mL (ref 0.00–4.00)

## 2023-04-27 ENCOUNTER — Ambulatory Visit (INDEPENDENT_AMBULATORY_CARE_PROVIDER_SITE_OTHER): Payer: Medicare Other | Admitting: Urology

## 2023-04-27 VITALS — BP 145/83 | Wt 233.2 lb

## 2023-04-27 DIAGNOSIS — N401 Enlarged prostate with lower urinary tract symptoms: Secondary | ICD-10-CM | POA: Diagnosis not present

## 2023-04-27 DIAGNOSIS — N529 Male erectile dysfunction, unspecified: Secondary | ICD-10-CM | POA: Diagnosis not present

## 2023-04-27 DIAGNOSIS — N400 Enlarged prostate without lower urinary tract symptoms: Secondary | ICD-10-CM

## 2023-04-27 DIAGNOSIS — C61 Malignant neoplasm of prostate: Secondary | ICD-10-CM

## 2023-04-27 LAB — BLADDER SCAN AMB NON-IMAGING: Scan Result: 34

## 2023-04-27 NOTE — Progress Notes (Signed)
I, [name], acting as a scribe for Kenneth Scotland, MD.,have documented all relevant documentation on the behalf of Kenneth Scotland, MD,as directed by  Kenneth Scotland, MD while in the presence of Kenneth Scotland, MD.  04/27/2023 9:54 AM   Kenneth Mayo 24-Oct-1956 [id number]  Referring provider: Barbette Reichmann, MD [address] [facility] [place],  [place] [address]  Chief Complaint  Patient presents with   Follow-up    HPI: 67 year-old male with a personal history of prostate cancer returns today for annual follow-up.  He is s/p Brachytherapy seed implantation in 2021. He also has baseline BPH with urinary frequency and is managed on Flomax and Oxybutynin. His most recent PSA on 04/24/2023 was 0.29. He has a personal history of erectile dysfunction and uses Sildenafil as needed.  He reports experiencing urgency, mostly in the morning. Sometimes he has nocturia. She has started to wean himself off the Oxybutynin. He can't tell any difference now that is taking less. He is almost out of Flomax as well. He recently started taking Amlodipine for HBP. He also has joint pain which he attributes to aging.    Results for orders placed or performed in visit on 04/27/23  BLADDER SCAN AMB NON-IMAGING  Result Value Ref Range   Scan Result 34 ml     IPSS     Row Name 04/27/23 0900         International Prostate Symptom Score   How often have you had the sensation of not emptying your bladder? Less than 1 in 5     How often have you had to urinate less than every two hours? About half the time     How often have you found you stopped and started again several times when you urinated? Less than 1 in 5 times     How often have you found it difficult to postpone urination? More than half the time     How often have you had a weak urinary stream? Not at All     How often have you had to strain to start urination? Less than 1 in 5 times     How many times did you typically  get up at night to urinate? 2 Times     Total IPSS Score 12       Quality of Life due to urinary symptoms   If you were to spend the rest of your life with your urinary condition just the way it is now how would you feel about that? Mixed             Score:  1-7 Mild 8-19 Moderate 20-35 Severe  PMH: Past Medical History:  Diagnosis Date   Anemia    Arrhythmia    Asthma    CAD (coronary artery disease)    Cancer (HCC)    prostate cancer   Elevated PSA    GERD (gastroesophageal reflux disease)    Heart attack [facility])     Surgical History: Past Surgical History:  Procedure Laterality Date   CORONARY ANGIOPLASTY WITH STENT PLACEMENT  2010   CYSTOSCOPY  06/07/2020   Procedure: CYSTOSCOPY;  Surgeon: Kenneth Scotland, MD;  Location: [facility] ORS;  Service: Urology;;   KNEE SURGERY Right 1998   NASAL SINUS SURGERY     RADIOACTIVE SEED IMPLANT N/A 06/07/2020   Procedure: RADIOACTIVE SEED IMPLANT/BRACHYTHERAPY IMPLANT;  Surgeon: Kenneth Scotland, MD;  Location: [facility] ORS;  Service: Urology;  Laterality: N/A;  Home Medications:  Allergies as of 04/27/2023       Reactions   Aspirin Rash   Can tolerate 81 mg   Cefaclor Rash   Keflex [cephalexin] Rash   Penicillins Rash   20 years        Medication List        Accurate as of Apr 27, 2023  9:54 AM. If you have any questions, ask your nurse or doctor.          acetaminophen 500 MG tablet Commonly known as: TYLENOL Take 1,000 mg by mouth every 6 (six) hours as needed for mild pain or moderate pain.   albuterol 108 (90 Base) MCG/ACT inhaler Commonly known as: VENTOLIN HFA Inhale 2 puffs into the lungs every 6 (six) hours as needed for wheezing or shortness of breath.   amLODipine 5 MG tablet Commonly known as: NORVASC Take by mouth.   aspirin 81 MG tablet Take 81 mg by mouth daily.   CO-ENZYME Q-10 PO Take 200 mg by mouth daily.   ferrous sulfate 325 (65 FE) MG tablet Take 325 mg by mouth daily.    GLUCOSAMINE-CHONDROITIN PO Take 1,500 mg by mouth daily.   GUAIFENESIN ER PO Take 800 mg by mouth in the morning and at bedtime.   losartan 100 MG tablet Commonly known as: COZAAR Take by mouth.   metoprolol succinate 25 MG 24 hr tablet Commonly known as: TOPROL-XL Take 50 mg by mouth daily.   montelukast 10 MG tablet Commonly known as: SINGULAIR Take 10 mg by mouth at bedtime.   nitroGLYCERIN 0.4 MG SL tablet Commonly known as: NITROSTAT Place under the tongue.   Omega 3-6-9 Caps Take 1 capsule by mouth daily.   oxybutynin 15 MG 24 hr tablet Commonly known as: DITROPAN XL Take 1 tablet (15 mg total) by mouth daily.   pantoprazole 40 MG tablet Commonly known as: PROTONIX Take 40 mg by mouth daily.   phenylephrine 10 MG Tabs tablet Commonly known as: SUDAFED PE Take 10 mg by mouth every 4 (four) hours as needed (facial pressure).   rosuvastatin 5 MG tablet Commonly known as: CRESTOR Take 5 mg by mouth daily.   sildenafil 20 MG tablet Commonly known as: REVATIO TAKE 1-5 TABLETS AS NEEDED PRIOR TO INTERCOURSE OR AS INSTRUCTED   tamsulosin 0.4 MG Caps capsule Commonly known as: FLOMAX Take by mouth.   Vitamin D 50 MCG (2000 UT) Caps Take 2,000 Units by mouth daily.   zinc gluconate 50 MG tablet Take 50 mg by mouth daily.        Allergies:  Allergies  Allergen Reactions   Aspirin Rash    Can tolerate 81 mg   Cefaclor Rash   Keflex [Cephalexin] Rash   Penicillins Rash    20 years    Family History: Family History  Problem Relation Age of Onset   Heart disease Father    Prostate cancer Brother    Kidney cancer Neg Hx    Bladder Cancer Neg Hx     Social History:  reports that he has never smoked. He has never used smokeless tobacco. He reports current alcohol use. He reports that he does not use drugs.   Physical Exam: BP (!) 145/83   Wt 233 lb 3.2 oz (105.8 kg)   BMI 29.94 kg/m   Constitutional:  Alert and oriented, No acute  distress. HEENT: [place] AT, moist mucus membranes.  Trachea midline, no masses. Neurologic: Grossly intact, no focal deficits, moving all 4  extremities. Psychiatric: Normal mood and affect.   Assessment & Plan:    Prostate cancer  - No evidence of disease. His PSA is low and stable. Explained his nadir now is around 0.3 and if it goes up 2 points it is concerning the prostate cancer is back.  - Continue to have his PSA check bi-annually.  2. BPH with lower urinary symptoms  - He has been weaning off the medications. He can't tell a difference either way if they are helping. He is going to completely stop taking them, evaluate his symptoms and let us know. He may not need anything or we may have him try a different medication.  3. Erectile Dysfunction  - Uses Sildenafil as needed.  Return in about 1 year (around 04/26/2024) for 1 yr with ipss/pvr/psa-ah.   [facility] [address] [place], [place] [address] [phone]

## 2023-05-04 ENCOUNTER — Ambulatory Visit: Payer: Medicare Other | Admitting: Urology

## 2023-07-26 ENCOUNTER — Ambulatory Visit
Admission: RE | Admit: 2023-07-26 | Discharge: 2023-07-26 | Disposition: A | Discharge status: Home / Self Care | Payer: Medicare Other | Source: Ambulatory Visit | Attending: Radiation Oncology | Admitting: Radiation Oncology

## 2023-07-26 ENCOUNTER — Encounter: Payer: Self-pay | Admitting: Radiation Oncology

## 2023-07-26 VITALS — BP 137/84 | HR 74 | Temp 97.6°F | Resp 16 | Wt 235.6 lb

## 2023-07-26 DIAGNOSIS — R5383 Other fatigue: Secondary | ICD-10-CM | POA: Diagnosis not present

## 2023-07-26 DIAGNOSIS — R197 Diarrhea, unspecified: Secondary | ICD-10-CM | POA: Diagnosis not present

## 2023-07-26 DIAGNOSIS — C61 Malignant neoplasm of prostate: Secondary | ICD-10-CM | POA: Insufficient documentation

## 2023-07-26 DIAGNOSIS — Z923 Personal history of irradiation: Secondary | ICD-10-CM | POA: Insufficient documentation

## 2023-07-26 NOTE — Progress Notes (Signed)
Radiation Oncology Follow up Note  Name: Kenneth Mayo   Date:   07/26/2023 MRN:  [id number] DOB: August 22, 1956    This 67 y.o. male presents to the clinic today for 4-year follow-up status post I-125 interstitial implant for Gleason 7 adenocarcinoma the prostate presenting with a PSA of 7.4.  REFERRING PROVIDER: Barbette Reichmann, MD  HPI: Patient is a 67 year old male now out over 4 years having completed I-125 interstitial implant for Gleason 7 adenocarcinoma seen today in routine follow-up he is doing well.  Specifically denies any increased lower urinary tract symptoms diarrhea or fatigue.Marland Kitchen  His PSA continues to be stable.  It is 0.29 down from 0.381-year prior.  COMPLICATIONS OF TREATMENT: none  FOLLOW UP COMPLIANCE: keeps appointments   PHYSICAL EXAM:  BP 137/84   Pulse 74   Temp 97.6 F (36.4 C) (Tympanic)   Resp 16   Wt 235 lb 9.6 oz (106.9 kg)   BMI 30.25 kg/m  Well-developed well-nourished patient in NAD. HEENT reveals PERLA, EOMI, discs not visualized.  Oral cavity is clear. No oral mucosal lesions are identified. Neck is clear without evidence of cervical or supraclavicular adenopathy. Lungs are clear to A&P. Cardiac examination is essentially unremarkable with regular rate and rhythm without murmur rub or thrill. Abdomen is benign with no organomegaly or masses noted. Motor sensory and DTR levels are equal and symmetric in the upper and lower extremities. Cranial nerves II through XII are grossly intact. Proprioception is intact. No peripheral adenopathy or edema is identified. No motor or sensory levels are noted. Crude visual fields are within normal range.  RADIOLOGY RESULTS: No current films to review  PLAN: Present time now that is over 4 years I am going to turn follow-up care over Dr. Apolinar Junes urology.  Be happy to reevaluate the patient in time the future should that be indicated.  Patient is to call with any concerns.  I would like to take this opportunity to thank you  for allowing me to participate in the care of your patient.Carmina Miller, MD

## 2024-04-18 ENCOUNTER — Other Ambulatory Visit
Admission: RE | Admit: 2024-04-18 | Discharge: 2024-04-18 | Disposition: A | Discharge status: Home / Self Care | Attending: Urology | Admitting: Urology

## 2024-04-18 DIAGNOSIS — C61 Malignant neoplasm of prostate: Secondary | ICD-10-CM | POA: Insufficient documentation

## 2024-04-18 LAB — PSA: Prostatic Specific Antigen: 0.21 ng/mL (ref 0.00–4.00)

## 2024-04-25 ENCOUNTER — Ambulatory Visit (INDEPENDENT_AMBULATORY_CARE_PROVIDER_SITE_OTHER): Payer: Self-pay | Admitting: Urology

## 2024-04-25 VITALS — BP 150/81 | HR 84 | Ht 74.0 in | Wt 235.0 lb

## 2024-04-25 DIAGNOSIS — C61 Malignant neoplasm of prostate: Secondary | ICD-10-CM

## 2024-04-25 DIAGNOSIS — R350 Frequency of micturition: Secondary | ICD-10-CM

## 2024-04-25 DIAGNOSIS — N400 Enlarged prostate without lower urinary tract symptoms: Secondary | ICD-10-CM | POA: Diagnosis not present

## 2024-04-25 LAB — BLADDER SCAN AMB NON-IMAGING

## 2024-04-25 NOTE — Progress Notes (Signed)
 Kenneth Mayo,acting as a scribe for Dustin Gimenez, MD.,have documented all relevant documentation on the behalf of Dustin Gimenez, MD,as directed by  Dustin Gimenez, MD while in the presence of Dustin Gimenez, MD.  04/25/24 2:22 PM   Kenneth Mayo [birth year] [id number]  Referring provider: Antonio Baumgarten, MD [address] [facility] [place],  [place] [address]  Chief Complaint  Patient presents with   Prostate Cancer    HPI:  68 year old male with a personal history of prostate cancer presents today for an annual follow-up.   His PSA remains stable at 0.21 as of 04/18/2024.   He is s/p Brachytherapy seed implantation in 2021. He also has baseline BPH with urinary frequency and is managed on Flomax  and Oxybutynin . His most recent PSA on 04/24/2023 was 0.29.    He has a personal history of erectile dysfunction and uses Sildenafil  as needed.   He reports urinating regularly, approximately every two hours during the day and every three hours at night, with no significant changes since discontinuing medications due to side effects and lack of benefit. He notes that bending over seems to induce a bladder contraction, but he has not noticed any significant difference in symptoms since stopping the medication. He has lost some weight, which he feels has helped with his symptoms. He is aware of the need to monitor his PSA levels and is comfortable with his primary care physician, Dr. Kevan Peers, managing this.   He inquires about his sons, aged 31 and 50, needing PSA checks, and is interested in genetic testing for prostate cancer risk.  Results for orders placed or performed in visit on 04/25/24  Bladder Scan (Post Void Residual) in office  Result Value Ref Range   Scan Result 27mL     IPSS     Row Name 04/25/24 0900         International Prostate Symptom Score   How often have you had the sensation of not emptying your bladder? Less than 1 in 5     How often have  you had to urinate less than every two hours? Almost always     How often have you found you stopped and started again several times when you urinated? Less than 1 in 5 times     How often have you found it difficult to postpone urination? More than half the time     How often have you had a weak urinary stream? Less than 1 in 5 times     How often have you had to strain to start urination? Not at All     How many times did you typically get up at night to urinate? 3 Times     Total IPSS Score 15       Quality of Life due to urinary symptoms   If you were to spend the rest of your life with your urinary condition just the way it is now how would you feel about that? Mostly Satisfied              Score:  1-7 Mild 8-19 Moderate 20-35 Severe   PMH: Past Medical History:  Diagnosis Date   Anemia    Arrhythmia    Asthma    CAD (coronary artery disease)    Cancer (HCC)    prostate cancer   Elevated PSA    GERD (gastroesophageal reflux disease)    Heart attack [facility])     Surgical History: Past  Surgical History:  Procedure Laterality Date   CORONARY ANGIOPLASTY WITH STENT PLACEMENT  2010   CYSTOSCOPY  06/07/2020   Procedure: CYSTOSCOPY;  Surgeon: Dustin Gimenez, MD;  Location: [facility] ORS;  Service: Urology;;   KNEE SURGERY Right 1998   NASAL SINUS SURGERY     RADIOACTIVE SEED IMPLANT N/A 06/07/2020   Procedure: RADIOACTIVE SEED IMPLANT/BRACHYTHERAPY IMPLANT;  Surgeon: Dustin Gimenez, MD;  Location: [facility] ORS;  Service: Urology;  Laterality: N/A;    Home Medications:  Allergies as of 04/25/2024       Reactions   Aspirin Rash   Can tolerate 81 mg   Cefaclor Rash   Keflex [cephalexin] Rash   Penicillins Rash   20 years        Medication List        Accurate as of Apr 25, 2024  2:22 PM. If you have any questions, ask your nurse or doctor.          acetaminophen 500 MG tablet Commonly known as: TYLENOL Take 1,000 mg by mouth every 6 (six) hours as needed for  mild pain or moderate pain.   albuterol 108 (90 Base) MCG/ACT inhaler Commonly known as: VENTOLIN HFA Inhale 2 puffs into the lungs every 6 (six) hours as needed for wheezing or shortness of breath.   amLODipine 5 MG tablet Commonly known as: NORVASC Take by mouth.   aspirin 81 MG tablet Take 81 mg by mouth daily.   CO-ENZYME Q-10 PO Take 200 mg by mouth daily.   ferrous sulfate 325 (65 FE) MG tablet Take 325 mg by mouth daily.   GLUCOSAMINE-CHONDROITIN PO Take 1,500 mg by mouth daily.   GUAIFENESIN ER PO Take 800 mg by mouth in the morning and at bedtime.   losartan 100 MG tablet Commonly known as: COZAAR Take by mouth.   metoprolol succinate 25 MG 24 hr tablet Commonly known as: TOPROL-XL Take 50 mg by mouth daily.   montelukast 10 MG tablet Commonly known as: SINGULAIR Take 10 mg by mouth at bedtime.   Multi-Vitamin tablet Take 1 tablet by mouth daily.   nitroGLYCERIN 0.4 MG SL tablet Commonly known as: NITROSTAT Place 0.4 mg under the tongue. Place 1 tablet (0.4 mg total) under the tongue every 5 (five) minutes as needed for Chest pain May take up to 3 doses.   Omega 3-6-9 Caps Take 1 capsule by mouth daily.   oxybutynin  15 MG 24 hr tablet Commonly known as: DITROPAN  XL Take 1 tablet (15 mg total) by mouth daily.   pantoprazole 40 MG tablet Commonly known as: PROTONIX Take 40 mg by mouth daily.   phenylephrine  10 MG Tabs tablet Commonly known as: SUDAFED PE Take 10 mg by mouth every 4 (four) hours as needed (facial pressure).   rosuvastatin 5 MG tablet Commonly known as: CRESTOR Take 5 mg by mouth daily.   sildenafil  20 MG tablet Commonly known as: REVATIO  TAKE 1-5 TABLETS AS NEEDED PRIOR TO INTERCOURSE OR AS INSTRUCTED   TURMERIC PO Take by mouth.   Vitamin D 50 MCG (2000 UT) Caps Take 2,000 Units by mouth daily.   zinc  gluconate 50 MG tablet Take 50 mg by mouth daily.        Allergies:  Allergies  Allergen Reactions   Aspirin  Rash    Can tolerate 81 mg   Cefaclor Rash   Keflex [Cephalexin] Rash   Penicillins Rash    20 years    Family History: Family History  Problem Relation Age  of Onset   Heart disease Father    Prostate cancer Brother    Kidney cancer Neg Hx    Bladder Cancer Neg Hx     Social History:  reports that he has never smoked. He has never used smokeless tobacco. He reports current alcohol use. He reports that he does not use drugs.   Physical Exam: BP (!) 150/81 (BP Location: Left Arm, Patient Position: Sitting, Cuff Size: Large)   Pulse 84   Ht 6\' 2"  (1.88 m)   Wt 235 lb (106.6 kg)   SpO2 95%   BMI 30.17 kg/m   Constitutional:  Alert and oriented, No acute distress. HEENT: [place] AT, moist mucus membranes.  Trachea midline, no masses. Neurologic: Grossly intact, no focal deficits, moving all 4 extremities. Psychiatric: Normal mood and affect.   Assessment & Plan:    1. Prostate Cancer Surveillance - PSA remains stable at 0.21.  - Continue annual PSA monitoring.  - Advised to have PSA checked by primary care physician, Dr. Kevan Peers, with a target to keep PSA below 2.2.  - He is informed to return if PSA levels increase significantly.  2. BPH with LUTS - He reports urinary frequency but is managing well without medication.  - No new treatment initiated as symptoms are not bothersome enough to warrant change.  3. Genetic Counseling Referral - Referral to genetics for evaluation of prostate cancer gene due to family history and potential implications for his sons.  Return in about 1 year (around 04/25/2025) for repeat PSA.  I have reviewed the above documentation for accuracy and completeness, and I agree with the above.   Dustin Gimenez, MD   [facility] [address] [place], [place] [address] [phone]

## 2024-06-04 ENCOUNTER — Inpatient Hospital Stay: Attending: Oncology | Admitting: Licensed Clinical Social Worker

## 2024-06-04 ENCOUNTER — Other Ambulatory Visit: Payer: Self-pay | Admitting: Licensed Clinical Social Worker

## 2024-06-04 ENCOUNTER — Inpatient Hospital Stay

## 2024-06-04 DIAGNOSIS — C61 Malignant neoplasm of prostate: Secondary | ICD-10-CM

## 2024-06-04 DIAGNOSIS — Z8042 Family history of malignant neoplasm of prostate: Secondary | ICD-10-CM

## 2024-06-04 LAB — GENETIC SCREENING ORDER

## 2024-06-04 NOTE — Progress Notes (Signed)
 REFERRING PROVIDER: Dustin Gimenez, MD [address] [place],  [place] [address]  PRIMARY PROVIDER:  Antonio Baumgarten, MD  PRIMARY REASON FOR VISIT:  1. Prostate CA (HCC)   2. Family history of prostate cancer      HISTORY OF PRESENT ILLNESS:   Kenneth Mayo, a 68 y.o. male, was seen for a [place] cancer genetics consultation at the request of Dr. Ace Holder due to a personal and family history of prostate cancer.  Kenneth Mayo presents to clinic today to discuss the possibility of a hereditary predisposition to cancer, genetic testing, and to further clarify his future cancer risks, as well as potential cancer risks for family members.   CANCER HISTORY:  In 2017, at the age of 55, Kenneth Mayo was diagnosed with prostate cancer. He was on surveillance until 2021 when disease progressed to intermediate risk (Gleason 3+4). The treatment plan included brachytherapy seed implantation.   Past Medical History:  Diagnosis Date   Anemia    Arrhythmia    Asthma    CAD (coronary artery disease)    Cancer (HCC)    prostate cancer   Elevated PSA    GERD (gastroesophageal reflux disease)    Heart attack [facility])     Past Surgical History:  Procedure Laterality Date   CORONARY ANGIOPLASTY WITH STENT PLACEMENT  2010   CYSTOSCOPY  06/07/2020   Procedure: CYSTOSCOPY;  Surgeon: Dustin Gimenez, MD;  Location: [facility] ORS;  Service: Urology;;   KNEE SURGERY Right 1998   NASAL SINUS SURGERY     RADIOACTIVE SEED IMPLANT N/A 06/07/2020   Procedure: RADIOACTIVE SEED IMPLANT/BRACHYTHERAPY IMPLANT;  Surgeon: Dustin Gimenez, MD;  Location: [facility] ORS;  Service: Urology;  Laterality: N/A;    FAMILY HISTORY:  We obtained a detailed, 4-generation family history.  Significant diagnoses are listed below: Family History  Problem Relation Age of Onset   Heart disease Father    Prostate cancer Brother    Kidney cancer Neg Hx    Bladder Cancer Neg Hx    Kenneth Mayo has 2 sons, 95 and 3. He has 1 brother  and 2 sisters. One brother had prostate cancer at 60-64 and is living at 24.  Kenneth Mayo mother passed at 38. Kenneth Mayo father passed at 33. No other known cancers on either side of the family.  Kenneth Mayo is unaware of previous family history of genetic testing for hereditary cancer risks. There is no reported Ashkenazi Jewish ancestry. There is no known consanguinity.    GENETIC COUNSELING ASSESSMENT: Kenneth Mayo is a 68 y.o. male with a personal and family history of prostate cancer which is somewhat suggestive of a hereditary cancer syndrome and predisposition to cancer. We, therefore, discussed and recommended the following at today's visit.   DISCUSSION: We discussed that approximately 10% of prostate cancer is hereditary. Most cases of hereditary prostate cancer are associated with BRCA1/BRCA2 genes, although there are other genes associated with hereditary prostate cancer as well. Cancers and risks are gene specific. We discussed that testing is beneficial for several reasons including knowing about cancer risks, identifying potential screening and risk-reduction options that may be appropriate, and to understand if other family members could be at risk for cancer and allow them to undergo genetic testing.   We reviewed the characteristics, features and inheritance patterns of hereditary cancer syndromes. We also discussed genetic testing, including the appropriate family members to test, the process of testing, insurance coverage and turn-around-time for results. We discussed the implications of  a negative, positive and/or variant of uncertain significant result. Although his personal/family history does not quite meet criteria for testing, he could consider pursuing genetic testing through the [facility] CancerNext+RNA gene panel.   The [facility] CancerNext+RNAinsight Panel includes sequencing, rearrangement analysis, and RNA analysis for the following 40 genes: APC, ATM, BAP1, BARD1, BMPR1A, BRCA1, BRCA2,  BRIP1, CDH1, CDKN2A, CHEK2, FH, FLCN, MET, MLH1, MSH2, MSH6, MUTYH, NF1, NTHL1, PALB2, PMS2, PTEN, RAD51C, RAD51D, RPS20, SMAD4, STK11, TP53, TSC1, TSC2, and VHL (sequencing and deletion/duplication); AXIN2, HOXB13, MBD4, MSH3, POLD1 and POLE (sequencing only); EPCAM and GREM1 (deletion/duplication only).  Based on Kenneth Mayo personal and family history of cancer, he does not meet medical criteria for genetic testing. He may have an out of pocket cost.   PLAN: After considering the risks, benefits, and limitations, Kenneth Mayo provided informed consent to pursue genetic testing and the blood sample was sent to [facility] for analysis of the CancerNext+RNA panel. Results should be available within approximately 2-3 weeks' time, at which point they will be disclosed by telephone to Kenneth Mayo, as will any additional recommendations warranted by these results. Kenneth Mayo will receive a summary of his genetic counseling visit and a copy of his results once available. This information will also be available in Epic.   Kenneth Mayo questions were answered to his satisfaction today. Our contact information was provided should additional questions or concerns arise. Thank you for the referral and allowing us  to share in the care of your patient.   Valri Gee, MS, [facility] Genetic Counselor [email or web address] Phone: [phone]  40 minutes were spent on the date of the encounter in service to the patient including preparation, face-to-face consultation, documentation and care coordination. Dr. Nelson Bandy was available for discussion regarding this case.   _______________________________________________________________________ For Office Staff:  Number of people involved in session: 1 Was an Intern/ student involved with case: no

## 2024-06-17 DEATH — deceased

## 2024-06-18 ENCOUNTER — Telehealth: Payer: Self-pay | Admitting: Licensed Clinical Social Worker

## 2024-06-18 ENCOUNTER — Encounter: Payer: Self-pay | Admitting: Licensed Clinical Social Worker

## 2024-06-18 ENCOUNTER — Ambulatory Visit: Payer: Self-pay | Admitting: Licensed Clinical Social Worker

## 2024-06-18 DIAGNOSIS — Z1379 Encounter for other screening for genetic and chromosomal anomalies: Secondary | ICD-10-CM | POA: Insufficient documentation

## 2024-06-18 NOTE — Progress Notes (Signed)
 HPI:   [name] was previously seen in the [facility] due to a personal and family history of cancer and concerns regarding a hereditary predisposition to cancer. Please refer to our prior cancer genetics clinic note for more information regarding our discussion, assessment and recommendations, at the time. [name] recent genetic test results were disclosed to him, as were recommendations warranted by these results. These results and recommendations are discussed in more detail below.  CANCER HISTORY:  Oncology History   No history exists.    FAMILY HISTORY:  We obtained a detailed, 4-generation family history.  Significant diagnoses are listed below: Family History  Problem Relation Age of Onset   Heart disease Father    Prostate cancer Brother    Kidney cancer Neg Hx    Bladder Cancer Neg Hx    [name] has 2 sons, 44 and 61. He has 1 brother and 2 sisters. One brother had prostate cancer at 51-64 and is living at 52.   [name] mother passed at 63. [name] father passed at 45. No other known cancers on either side of the family.   [name] is unaware of previous family history of genetic testing for hereditary cancer risks. There is no reported Ashkenazi Jewish ancestry. There is no known consanguinity.      GENETIC TEST RESULTS:  The [facility] CancerNext+RNA Panel found no pathogenic mutations.   The [facility] CancerNext+RNAinsight Panel includes sequencing, rearrangement analysis, and RNA analysis for the following 40 genes: APC, ATM, BAP1, BARD1, BMPR1A, BRCA1, BRCA2, BRIP1, CDH1, CDKN2A, CHEK2, FH, FLCN, MET, MLH1, MSH2, MSH6, MUTYH, NF1, NTHL1, PALB2, PMS2, PTEN, RAD51C, RAD51D, RPS20, SMAD4, STK11, TP53, TSC1, TSC2, and VHL (sequencing and deletion/duplication); AXIN2, HOXB13, MBD4, MSH3, POLD1 and POLE (sequencing only); EPCAM and GREM1 (deletion/duplication only).  The test report has been scanned into EPIC and is located under the Molecular Pathology  section of the Results Review tab.  A portion of the result report is included below for reference. Genetic testing reported out on 06/17/2024.      Genetic testing identified a variant of uncertain significance (VUS) in the MSH2 gene called p.[id number].  At this time, it is unknown if this variant is associated with an increased risk for cancer or if it is benign, but most uncertain variants are reclassified to benign. It should not be used to make medical management decisions. With time, we suspect the laboratory will determine the significance of this variant, if any. If the laboratory reclassifies this variant, we will attempt to contact [name] to discuss it further.   Even though a pathogenic variant was not identified, possible explanations for the cancer in the family may include: There may be no hereditary risk for cancer in the family. The cancers in [name] and/or his family may be sporadic/familial or due to other genetic and environmental factors. There may be a gene mutation in one of these genes that current testing methods cannot detect but that chance is small. There could be another gene that has not yet been discovered, or that we have not yet tested, that is responsible for the cancer diagnoses in the family.  It is also possible there is a hereditary cause for the cancer in the family that [name] did not inherit.  Therefore, it is important to remain in touch with cancer genetics in the future so that we can continue to offer [name] the most up to date genetic testing.  ADDITIONAL GENETIC TESTING:  We discussed with [name] that his genetic testing was fairly extensive.  If there are additional relevant genes identified to increase cancer risk that can be analyzed in the future, we would be happy to discuss and coordinate this testing at that time.    CANCER SCREENING RECOMMENDATIONS:  [name] test result is considered negative (normal).  This means that we have not identified  a hereditary cause for his personal and family history of cancer at this time.   An individual's cancer risk and medical management are not determined by genetic test results alone. Overall cancer risk assessment incorporates additional factors, including personal medical history, family history, and any available genetic information that may result in a personalized plan for cancer prevention and surveillance. Therefore, it is recommended he continue to follow the cancer management and screening guidelines provided by his primary healthcare provider.  RECOMMENDATIONS FOR FAMILY MEMBERS:   Since he did not inherit a identifiable mutation in a cancer predisposition gene included on this panel, his children could not have inherited a known mutation from him in one of these genes. Individuals in this family might be at some increased risk of developing cancer, over the general population risk, due to the family history of cancer.  Individuals in the family should notify their providers of the family history of cancer. We recommend women in this family have a yearly mammogram beginning at age 1, or 2 years younger than the earliest onset of cancer, an annual clinical breast exam, and perform monthly breast self-exams.  Family members should have colonoscopies by at age 48, or earlier, as recommended by their providers. We do not recommend familial testing for the MSH2 variant of uncertain significance (VUS).  FOLLOW-UP:  Lastly, we discussed with [name] that cancer genetics is a rapidly advancing field and it is possible that new genetic tests will be appropriate for him and/or his family members in the future. We encouraged him to remain in contact with cancer genetics on an annual basis so we can update his personal and family histories and let him know of advances in cancer genetics that may benefit this family.   Our contact number was provided. [name] questions were answered to his satisfaction,  and he knows he is welcome to call us  at anytime with additional questions or concerns.    Kenneth Cary, MS, [facility] Genetic Counselor [email or web address] Phone: [phone]

## 2024-06-18 NOTE — Telephone Encounter (Signed)
 I contacted Mr. Khachatryan to discuss his genetic testing results. No pathogenic variants were identified in the 40 genes analyzed. Detailed clinic note to follow.   The test report has been scanned into EPIC and is located under the Molecular Pathology section of the Results Review tab.  A portion of the result report is included below for reference.      Dena Cary, MS, [facility] Genetic Counselor [email or web address] Phone: [phone]

## 2024-10-23 ENCOUNTER — Other Ambulatory Visit
Admission: RE | Admit: 2024-10-23 | Discharge: 2024-10-23 | Disposition: A | Discharge status: Home / Self Care | Source: Ambulatory Visit | Attending: Physician Assistant | Admitting: Physician Assistant

## 2024-10-23 DIAGNOSIS — J40 Bronchitis, not specified as acute or chronic: Secondary | ICD-10-CM | POA: Insufficient documentation

## 2024-10-23 LAB — D-DIMER, QUANTITATIVE: D-Dimer, Quant: 0.45 ug{FEU}/mL (ref 0.00–0.50)
